# Patient Record
Sex: Female | Born: 2000 | Race: White | Hispanic: No | Marital: Single | Smoking: Never smoker
Health system: Southern US, Community
[De-identification: ages and names within clinical notes are randomized; demographics above are authoritative.]

## PROBLEM LIST (undated history)

## (undated) DIAGNOSIS — F32A Depression, unspecified: Secondary | ICD-10-CM

## (undated) DIAGNOSIS — F319 Bipolar disorder, unspecified: Secondary | ICD-10-CM

## (undated) DIAGNOSIS — L309 Dermatitis, unspecified: Secondary | ICD-10-CM

## (undated) DIAGNOSIS — F449 Dissociative and conversion disorder, unspecified: Secondary | ICD-10-CM

## (undated) DIAGNOSIS — F419 Anxiety disorder, unspecified: Secondary | ICD-10-CM

## (undated) DIAGNOSIS — F909 Attention-deficit hyperactivity disorder, unspecified type: Secondary | ICD-10-CM

## (undated) DIAGNOSIS — Z7289 Other problems related to lifestyle: Secondary | ICD-10-CM

## (undated) HISTORY — DX: Dermatitis, unspecified: L30.9

---
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
## 2022-01-25 ENCOUNTER — Telehealth: Payer: BC Managed Care – PPO | Admitting: Physician Assistant

## 2022-01-25 NOTE — Progress Notes (Signed)
The patient no-showed for appointment despite this provider sending direct link, reaching out via phone with no response and waiting for at least 10 minutes from appointment time for patient to join. They will be marked as a NS for this appointment/time.  ? ?Devoiry Corriher Cody Demetreus Lothamer, PA-C ? ? ? ?

## 2022-01-26 ENCOUNTER — Ambulatory Visit
Admission: EM | Admit: 2022-01-26 | Discharge: 2022-01-26 | Disposition: A | Discharge status: Home / Self Care | Payer: BC Managed Care – PPO | Attending: Emergency Medicine | Admitting: Emergency Medicine

## 2022-01-26 DIAGNOSIS — J329 Chronic sinusitis, unspecified: Secondary | ICD-10-CM | POA: Diagnosis not present

## 2022-01-26 DIAGNOSIS — R051 Acute cough: Secondary | ICD-10-CM

## 2022-01-26 DIAGNOSIS — J310 Chronic rhinitis: Secondary | ICD-10-CM | POA: Insufficient documentation

## 2022-01-26 DIAGNOSIS — J358 Other chronic diseases of tonsils and adenoids: Secondary | ICD-10-CM | POA: Diagnosis not present

## 2022-01-26 DIAGNOSIS — J3089 Other allergic rhinitis: Secondary | ICD-10-CM | POA: Insufficient documentation

## 2022-01-26 DIAGNOSIS — R0982 Postnasal drip: Secondary | ICD-10-CM

## 2022-01-26 DIAGNOSIS — K1370 Unspecified lesions of oral mucosa: Secondary | ICD-10-CM | POA: Diagnosis not present

## 2022-01-26 DIAGNOSIS — J351 Hypertrophy of tonsils: Secondary | ICD-10-CM

## 2022-01-26 DIAGNOSIS — J302 Other seasonal allergic rhinitis: Secondary | ICD-10-CM | POA: Diagnosis not present

## 2022-01-26 DIAGNOSIS — J029 Acute pharyngitis, unspecified: Secondary | ICD-10-CM

## 2022-01-26 LAB — POCT RAPID STREP A (OFFICE): Rapid Strep A Screen: NEGATIVE

## 2022-01-26 MED ORDER — FLUTICASONE PROPIONATE 50 MCG/ACT NA SUSP: 1.0000 | Freq: Every day | NASAL | 1 refills | Status: DC

## 2022-01-26 MED ORDER — FEXOFENADINE HCL 180 MG PO TABS: 180.0000 mg | ORAL_TABLET | Freq: Every day | ORAL | 1 refills | Status: DC

## 2022-01-26 MED ORDER — NYSTATIN 100000 UNIT/ML MT SUSP: 500000.0000 [IU] | Freq: Four times a day (QID) | OROMUCOSAL | 0 refills | Status: DC

## 2022-01-26 MED ORDER — METHYLPREDNISOLONE 4 MG PO TBPK: ORAL_TABLET | ORAL | 0 refills | Status: DC

## 2022-01-26 MED ORDER — CHLORHEXIDINE GLUCONATE 0.12 % MT SOLN: 15.0000 mL | Freq: Two times a day (BID) | OROMUCOSAL | 0 refills | Status: DC

## 2022-01-26 NOTE — ED Provider Notes (Addendum)
?[facility] ? ? ? ?CSN: [id number] ?Arrival date & time: 01/26/22  4827 ?  ? ?HISTORY  ? ?Chief Complaint  ?Patient presents with  ? Otalgia  ? Nasal Congestion  ? Sore Throat  ? ?HPI ?Monica Burke is a 21 y.o. adult. Patient complains of congestion with clear sinus drainage, sore throat, bilateral pressure in her ears without meaningful pain and some small white spots on the top of her mouth towards the back.  Patient states she is also had a "vicious" cough that is worse at night and only occasionally productive of small amounts of sputum.  Patient states her symptoms began 2 days ago.  Patient states she has a history of significant allergies, states allergy medications do not work so she is not taking any at this time.  Patient states she noticed after recently coming back from the beach that while she was at the beach her symptoms improved and when she returned home they got worse again.  Patient denies fever, aches, chills, nausea, vomiting, diarrhea, difficulty swallowing, difficulty maintaining secretions, shortness of breath, wheezing, skin manifestations. ? ?The history is provided by the patient.  ?Past Medical History:  ?Diagnosis Date  ? ADHD (attention deficit hyperactivity disorder)   ? Anxiety   ? Bipolar disorder (HCC)   ? Conversion disorder   ? Depression   ? Dissociative disorder   ? Eczema   ? Self mutilating behavior   ? ?Patient Active Problem List  ? Diagnosis Date Noted  ? COVID-19   ? Seizure-like activity (HCC) 01/30/2021  ? Severe recurrent major depression without psychotic features (HCC) 01/06/2021  ? Altered mental status 01/06/2021  ? Attention deficit hyperactivity disorder (ADHD), combined type 07/30/2019  ? Generalized anxiety disorder 07/30/2019  ? Depression in pediatric patient 07/30/2019  ? History of self injurious behavior 07/30/2019  ? Social anxiety disorder 07/30/2019  ? 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 ?History reviewed. No pertinent surgical  history. ?OB History   ?No obstetric history on file. ?  ? ?Home Medications   ? ?Prior to Admission medications   ?Medication Sig Start Date End Date Taking? Authorizing Provider  ?chlorhexidine (PERIDEX) 0.12 % solution Use as directed 15 mLs in the mouth or throat 2 (two) times daily. 01/26/22  Yes Theadora Rama Scales, PA-C  ?fexofenadine (ALLEGRA) 180 MG tablet Take 1 tablet (180 mg total) by mouth daily. 01/26/22 07/25/22 Yes Theadora Rama Scales, PA-C  ?fluticasone (FLONASE) 50 MCG/ACT nasal spray Place 1 spray into both nostrils daily. Begin by using 2 sprays in each nare daily for 3 to 5 days, then decrease to 1 spray in each nare daily. 01/26/22  Yes Theadora Rama Scales, PA-C  ?methylPREDNISolone (MEDROL DOSEPAK) 4 MG TBPK tablet Take 24 mg on day 1, 20 mg on day 2, 16 mg on day 3, 12 mg on day 4, 8 mg on day 5, 4 mg on day 6. 01/26/22  Yes Theadora Rama Scales, PA-C  ?nystatin (MYCOSTATIN) 100000 UNIT/ML suspension Take 5 mLs (500,000 Units total) by mouth 4 (four) times daily. 01/26/22  Yes Theadora Rama Scales, PA-C  ?lamoTRIgine (LAMICTAL) 25 MG tablet Take 75 mg by mouth daily. 12/23/20   [provider]  ?buPROPion (WELLBUTRIN XL) 150 MG 24 hr tablet Take 150 mg by mouth daily. 12/23/20 01/06/21  [provider]  ? ?Family History ?Family History  ?Problem Relation Age of Onset  ? Anxiety disorder Mother   ? Depression Mother   ? Diabetes type I Mother   ?  Hyperlipidemia Mother   ? Heart disease Mother   ? Alcohol abuse Mother   ? Drug abuse Mother   ? Drug abuse Father   ? Anxiety disorder Sister   ? Depression Sister   ? Anxiety disorder Maternal Grandmother   ? Depression Maternal Grandmother   ? Drug abuse Maternal Grandmother   ? Heart disease Maternal Grandfather   ? Hyperlipidemia Maternal Grandfather   ? Hypertension Maternal Grandfather   ? Alcohol abuse Maternal Grandfather   ? Drug abuse Maternal Grandfather   ? Hyperlipidemia Paternal Grandmother   ? Alcohol abuse  Paternal Grandmother   ? ?Social History ?Social History  ? ?Tobacco Use  ? Smoking status: Never  ? Smokeless tobacco: Never  ?Vaping Use  ? Vaping Use: Every day  ?Substance Use Topics  ? Alcohol use: No  ? Drug use: No  ? ?Allergies   ?Wellbutrin [bupropion] and Latex ? ?Review of Systems ?Review of Systems ?Pertinent findings noted in history of present illness.  ? ?Physical Exam ?Triage Vital Signs ?ED Triage Vitals  ?Enc Vitals Group  ?   BP 08/13/21 0827 (!) 147/82  ?   Pulse Rate 08/13/21 0827 72  ?   Resp 08/13/21 0827 18  ?   Temp 08/13/21 0827 98.3 ?F (36.8 ?C)  ?   Temp Source 08/13/21 0827 Oral  ?   SpO2 08/13/21 0827 98 %  ?   Weight --   ?   Height --   ?   Head Circumference --   ?   Peak Flow --   ?   Pain Score 08/13/21 0826 5  ?   Pain Loc --   ?   Pain Edu? --   ?   Excl. in GC? --   ?No data found. ? ?Updated Vital Signs ?BP 119/82 (BP Location: Left Arm)   Pulse 98   Temp 99.7 ?F (37.6 ?C) (Oral)   Resp 18   LMP 01/17/2022 (Approximate)   SpO2 96%  ? ?Physical Exam ?Vitals and nursing note reviewed.  ?Constitutional:   ?   General: Der Ferraiolo is not in acute distress. ?   Appearance: Normal appearance. Lujuana Both is not ill-appearing.  ?HENT:  ?   Head: Normocephalic and atraumatic.  ?   Salivary Glands: Right salivary gland is not diffusely enlarged or tender. Left salivary gland is not diffusely enlarged or tender.  ?   Right Ear: Ear canal and external ear normal. No drainage. A middle ear effusion is present. There is no impacted cerumen. Tympanic membrane is bulging. Tympanic membrane is not injected or erythematous.  ?   Left Ear: Ear canal and external ear normal. No drainage. A middle ear effusion is present. There is no impacted cerumen. Tympanic membrane is bulging. Tympanic membrane is not injected or erythematous.  ?   Ears:  ?   Comments: Bilateral EACs normal, both TMs bulging with clear fluid ?   Nose: Rhinorrhea present. No nasal deformity, septal deviation, signs of  injury, nasal tenderness, mucosal edema or congestion. Rhinorrhea is clear.  ?   Right Nostril: Occlusion present. No foreign body, epistaxis or septal hematoma.  ?   Left Nostril: Occlusion present. No foreign body, epistaxis or septal hematoma.  ?   Right Turbinates: Enlarged, swollen and pale.  ?   Left Turbinates: Enlarged, swollen and pale.  ?   Right Sinus: No maxillary sinus tenderness or frontal sinus tenderness.  ?   Left Sinus: No maxillary  sinus tenderness or frontal sinus tenderness.  ?   Mouth/Throat:  ?   Lips: Pink. No lesions.  ?   Mouth: Mucous membranes are moist. No oral lesions.  ?   Tongue: Lesions present.  ?   Pharynx: Oropharynx is clear. Uvula midline. No posterior oropharyngeal erythema or uvula swelling.  ?   Tonsils: No tonsillar exudate. 0 on the right. 0 on the left.  ?   Comments: Postnasal drip. ? ?Small white lesions located on the right posterior portion of the palate without erythema or irritation.  Patient states these are nontender to palpation. ?Eyes:  ?   General: Lids are normal.     ?   Right eye: No discharge.     ?   Left eye: No discharge.  ?   Extraocular Movements: Extraocular movements intact.  ?   Conjunctiva/sclera: Conjunctivae normal.  ?   Right eye: Right conjunctiva is not injected.  ?   Left eye: Left conjunctiva is not injected.  ?Neck:  ?   Trachea: Trachea and phonation normal.  ?Cardiovascular:  ?   Rate and Rhythm: Normal rate and regular rhythm.  ?   Pulses: Normal pulses.  ?   Heart sounds: Normal heart sounds. No murmur heard. ?  No friction rub. No gallop.  ?Pulmonary:  ?   Effort: Pulmonary effort is normal. No accessory muscle usage, prolonged expiration or respiratory distress.  ?   Breath sounds: Normal breath sounds. No stridor, decreased air movement or transmitted upper airway sounds. No decreased breath sounds, wheezing, rhonchi or rales.  ?Chest:  ?   Chest wall: No tenderness.  ?Musculoskeletal:     ?   General: Normal range of motion.  ?    Cervical back: Normal range of motion and neck supple. Normal range of motion.  ?Lymphadenopathy:  ?   Cervical: No cervical adenopathy.  ?Skin: ?   General: Skin is warm and dry.  ?   Findings: No erythema or rash.  ?Neurol

## 2022-01-26 NOTE — ED Triage Notes (Signed)
Pt c/o congestion, sore throat, bilateral otalgia and seeing white spots in the back of her throat.  ?Started: Monday night  ?

## 2022-01-26 NOTE — Discharge Instructions (Addendum)
Your strep test today is negative.  Throat culture will be performed per our protocol.  The result of your throat culture will be posted to your MyChart once it is complete, this typically takes 3 to 5 days.  If there is a positive result, you will be contacted by phone and antibiotics will be prescribed for you.  Please discard your toothbrush and any other oral devices that you are currently using and replace them with new ones if your strep culture is positive within 24 hours of beginning antibiotics. ?  ?Please see the list below for recommended medications, dosages and frequencies to provide relief of your current symptoms:   ?  ?Your symptoms and my physical exam findings are concerning for exacerbation of your underlying allergies.  It is important that you begin your allergy regiment now and are consistent with taking allergy medications exactly as prescribed.  Allergy medications are preventative and therefore only work well when they are taken daily, not "as needed". ?  ?Please see the list below for recommended medications, dosages and frequencies to provide relief of your current symptoms:   ?  ?Methylprednisolone (Medrol Dosepak): This is a steroid that will significantly calm your upper and lower airways, please take one row of tablets daily with your breakfast meal starting tomorrow morning until the prescription is complete.    ?  ?Allegra (fexofenadine): This is an excellent second-generation antihistamine that helps to reduce respiratory inflammatory response to environmental allergens.  This medication is not known to cause daytime sleepiness so it can be taken in the daytime.  If you find that it does make you sleepy, please feel free to take it at bedtime. ?  ?Flonase (fluticasone): This is a steroid nasal spray that you use once daily, 1 spray in each nare.  This medication does not work well if you decide to use it only used as you feel you need to, it works best used on a daily basis.  After  3 to 5 days of use, you will notice significant reduction of the inflammation and mucus production that is currently being caused by exposure to allergens, whether seasonal or environmental.  The most common side effect of this medication is nosebleeds.  If you experience a nosebleed, please discontinue use for 1 week, then feel free to resume.  I have provided you with a prescription but you can also purchase this medication over-the-counter if your insurance will not cover it. ? ?For the lesions in your mouth, I provided you with 2 mouthwashes, chlorhexidine and nystatin.  The nystatin is used 4 times daily and the chlorhexidine is used twice daily.  Feel free to combine the 2 when using the chlorhexidine.  If these lesions have not improved or resolved after 7 to 10 days, please follow-up with your primary care or your dentist. ?  ?It is important to keep in mind that not taking your allergy medications as prescribed can increase your risk of more frequent upper respiratory infections, lower respiratory disorders, skin reactions, and eye irritations that may or may not require the use of antibiotics and steroids and can result in loss of time at work, celebrations with family and friends as well as missed social opportunities. ?  ?If you find that you have not had significant relief of your allergy symptoms in the next 7 to 10 days, please follow-up with your primary care provider or return here to urgent care for repeat evaluation and further recommendations. ?  ?Thank you for  visiting urgent care today.  We appreciate the opportunity to participate in your care. ? ?

## 2022-01-27 ENCOUNTER — Ambulatory Visit: Payer: BC Managed Care – PPO | Admitting: Nurse Practitioner

## 2022-01-28 LAB — CULTURE, GROUP A STREP (THRC)

## 2022-09-24 ENCOUNTER — Encounter (HOSPITAL_COMMUNITY): Payer: Self-pay

## 2022-09-24 ENCOUNTER — Other Ambulatory Visit: Payer: Self-pay

## 2022-09-24 ENCOUNTER — Emergency Department (HOSPITAL_COMMUNITY)
Admission: EM | Admit: 2022-09-24 | Discharge: 2022-09-24 | Disposition: A | Discharge status: Home / Self Care | Payer: BC Managed Care – PPO | Attending: Emergency Medicine | Admitting: Emergency Medicine

## 2022-09-24 DIAGNOSIS — R110 Nausea: Secondary | ICD-10-CM | POA: Diagnosis not present

## 2022-09-24 DIAGNOSIS — Z91040 Latex allergy status: Secondary | ICD-10-CM | POA: Diagnosis not present

## 2022-09-24 DIAGNOSIS — S0990XA Unspecified injury of head, initial encounter: Secondary | ICD-10-CM | POA: Diagnosis not present

## 2022-09-24 DIAGNOSIS — F10929 Alcohol use, unspecified with intoxication, unspecified: Secondary | ICD-10-CM | POA: Diagnosis not present

## 2022-09-24 DIAGNOSIS — S0083XA Contusion of other part of head, initial encounter: Secondary | ICD-10-CM | POA: Diagnosis not present

## 2022-09-24 NOTE — Discharge Instructions (Signed)
You had a normal neurologic exam while in the emergency department.  This is reassuring.  It is possible that you may have a mild concussion.  A concussion is a diagnosis that is made clinically and does not show any abnormal results on imaging such as a CT scan or MRI.    A concussion may cause a persistent headache over the next few days. This can be brought on or worsened by loud sounds or bright lights. Try to avoid excessive use of cell phones, television, video games as this may worsening headaches.  Avoid strenuous activity and heavy lifting over the next few days.  If you develop severe worsening of your headache, vision changes or loss, uncontrolled vomiting, numbness or tingling to one side of your body, difficulty walking or lifting your arms or legs, return promptly to the emergency department for repeat evaluation.
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
## 2022-10-24 ENCOUNTER — Ambulatory Visit
Admission: RE | Admit: 2022-10-24 | Discharge: 2022-10-24 | Disposition: A | Discharge status: Home / Self Care | Payer: BC Managed Care – PPO | Source: Ambulatory Visit | Attending: Emergency Medicine | Admitting: Emergency Medicine

## 2022-10-24 VITALS — BP 115/79 | HR 84 | Temp 97.5°F | Resp 16

## 2022-10-24 DIAGNOSIS — J3089 Other allergic rhinitis: Secondary | ICD-10-CM

## 2022-10-24 DIAGNOSIS — J302 Other seasonal allergic rhinitis: Secondary | ICD-10-CM | POA: Diagnosis not present

## 2022-10-24 MED ORDER — FEXOFENADINE HCL 180 MG PO TABS: 180.0000 mg | ORAL_TABLET | Freq: Every day | ORAL | 1 refills | Status: DC

## 2022-10-24 MED ORDER — FLUTICASONE PROPIONATE 50 MCG/ACT NA SUSP: 1.0000 | Freq: Every day | NASAL | 1 refills | Status: DC

## 2022-10-24 MED ORDER — TRIAMCINOLONE ACETONIDE 40 MG/ML IJ SUSP
40.0000 mg | Freq: Once | INTRAMUSCULAR | Status: AC
  Administered 2022-10-24: 40 mg via INTRAMUSCULAR

## 2022-10-24 MED ORDER — CETIRIZINE HCL 10 MG PO TABS: 10.0000 mg | ORAL_TABLET | Freq: Every day | ORAL | 1 refills | Status: DC

## 2022-10-24 MED ORDER — LEVOCETIRIZINE DIHYDROCHLORIDE 5 MG PO TABS: 5.0000 mg | ORAL_TABLET | Freq: Every evening | ORAL | 1 refills | Status: DC

## 2022-10-24 MED ORDER — MOMETASONE FUROATE 50 MCG/ACT NA SUSP: 2.0000 | Freq: Every day | NASAL | 2 refills | Status: DC

## 2022-10-24 NOTE — ED Provider Notes (Signed)
[facility]    CSN: [id number] Arrival date & time: 10/24/22  1641    HISTORY   Chief Complaint  Patient presents with   Nasal Congestion    Possible sinus infection - Entered by patient   Cough   HPI Monica Burke is a pleasant, 22 y.o. adult who presents to urgent care today. Patient complains of nasal congestion, runny nose and nonproductive cough.  Patient states her symptoms began 3 days ago.  Patient states has been taking over-the-counter cold medication without meaningful relief of her symptoms.  Patient was seen at this location in March of 2023 where she was diagnosed with allergic rhinitis and provided with prescriptions for allergy medications.  Patient states she is no longer taking any allergy medications.  Patient states she went out of town a little over a week ago and stayed at a condo at [employer], states it was snowing and damp at the time and has been a lot of time outside, thinks she may have caught a cold.    Past Medical History:  Diagnosis Date   ADHD (attention deficit hyperactivity disorder)    Anxiety    Bipolar disorder ([place])    Conversion disorder    Depression    Dissociative disorder    Eczema    Self mutilating behavior    Patient Active Problem List   Diagnosis Date Noted   COVID-19    Seizure-like activity ([place]) 01/30/2021   Severe recurrent major depression without psychotic features (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 No pertinent surgical history. OB History   No obstetric history on file.    Home Medications    Prior to Admission medications   Medication Sig Start Date End Date Taking? Authorizing  Provider  chlorhexidine (PERIDEX) 0.12 % solution Use as directed 15 mLs in the mouth or throat 2 (two) times daily. 01/26/22   Lynden Oxford Scales, PA-C  fexofenadine (ALLEGRA) 180 MG tablet Take 1 tablet (180 mg total) by mouth daily. 01/26/22 07/25/22  Lynden Oxford Scales, PA-C  fluticasone (FLONASE) 50 MCG/ACT nasal spray Place 1 spray into both nostrils daily. Begin by using 2 sprays in each nare daily for 3 to 5 days, then decrease to 1 spray in each nare daily. 01/26/22   Lynden Oxford Scales, PA-C  lamoTRIgine (LAMICTAL) 25 MG tablet Take 75 mg by mouth daily. 12/23/20   [provider]  methylPREDNISolone (MEDROL DOSEPAK) 4 MG TBPK tablet Take 24 mg on day 1, 20 mg on day 2, 16 mg on day 3, 12 mg on day 4, 8 mg on day 5, 4 mg on day 6. 01/26/22   Lynden Oxford Scales, PA-C  nystatin (MYCOSTATIN) 100000 UNIT/ML suspension Take 5 mLs (500,000 Units total) by mouth 4 (four) times daily. 01/26/22   Lynden Oxford Scales, PA-C  buPROPion (WELLBUTRIN XL) 150 MG 24 hr tablet Take 150 mg by mouth daily. 12/23/20 01/06/21  [provider]    Family History Family History  Problem Relation Age of Onset   Anxiety disorder Mother    Depression Mother    Diabetes type I Mother    Hyperlipidemia Mother    Heart disease Mother    Alcohol abuse Mother  Drug abuse Mother    Drug abuse Father    Anxiety disorder Sister    Depression Sister    Anxiety disorder Maternal Grandmother    Depression Maternal Grandmother    Drug abuse Maternal Grandmother    Heart disease Maternal Grandfather    Hyperlipidemia Maternal Grandfather    Hypertension Maternal Grandfather    Alcohol abuse Maternal Grandfather    Drug abuse Maternal Grandfather    Hyperlipidemia Paternal Grandmother    Alcohol abuse Paternal Grandmother    Social History Social History   Tobacco Use   Smoking status: Never   Smokeless tobacco: Never  Vaping Use   Vaping Use: Every day  Substance Use Topics    Alcohol use: Yes   Drug use: No   Allergies   Wellbutrin [bupropion] and Latex  Review of Systems Review of Systems Pertinent findings revealed after performing a 14 point review of systems has been noted in the history of present illness.  Physical Exam Triage Vital Signs ED Triage Vitals  Enc Vitals Group     BP 08/13/21 0827 (!) 147/82     Pulse Rate 08/13/21 0827 72     Resp 08/13/21 0827 18     Temp 08/13/21 0827 98.3 F (36.8 C)     Temp Source 08/13/21 0827 Oral     SpO2 08/13/21 0827 98 %     Weight --      Height --      Head Circumference --      Peak Flow --      Pain Score 08/13/21 0826 5     Pain Loc --      Pain Edu? --      Excl. in [place]? --   No data found.  Updated Vital Signs BP 115/79 (BP Location: Right Arm)   Pulse 84   Temp (!) 97.5 F (36.4 C) (Oral)   Resp 16   LMP 10/05/2022   SpO2 96%   Physical Exam Vitals and nursing note reviewed.  Constitutional:      General: Monica Burke is not in acute distress.    Appearance: Normal appearance. Monica Burke is not ill-appearing.  HENT:     Head: Normocephalic and atraumatic.     Salivary Glands: Right salivary gland is not diffusely enlarged or tender. Left salivary gland is not diffusely enlarged or tender.     Right Ear: Ear canal and external ear normal. No drainage. A middle ear effusion is present. There is no impacted cerumen. Tympanic membrane is bulging. Tympanic membrane is not injected or erythematous.     Left Ear: Ear canal and external ear normal. No drainage. A middle ear effusion is present. There is no impacted cerumen. Tympanic membrane is bulging. Tympanic membrane is not injected or erythematous.     Ears:     Comments: Bilateral EACs normal, both TMs bulging with clear fluid    Nose: Rhinorrhea present. No nasal deformity, septal deviation, signs of injury, nasal tenderness, mucosal edema or congestion. Rhinorrhea is clear.     Right Nostril: Occlusion present. No foreign body,  epistaxis or septal hematoma.     Left Nostril: Occlusion present. No foreign body, epistaxis or septal hematoma.     Right Turbinates: Enlarged, swollen and pale.     Left Turbinates: Enlarged, swollen and pale.     Right Sinus: No maxillary sinus tenderness or frontal sinus tenderness.     Left Sinus: No maxillary sinus tenderness or frontal sinus tenderness.  Comments: Multiple nose piercings    Mouth/Throat:     Lips: Pink. No lesions.     Mouth: Mucous membranes are moist. No oral lesions.     Pharynx: Oropharynx is clear. Uvula midline. No posterior oropharyngeal erythema or uvula swelling.     Tonsils: No tonsillar exudate. 0 on the right. 0 on the left.     Comments: Postnasal drip Eyes:     General: Lids are normal.        Right eye: No discharge.        Left eye: No discharge.     Extraocular Movements: Extraocular movements intact.     Conjunctiva/sclera: Conjunctivae normal.     Right eye: Right conjunctiva is not injected.     Left eye: Left conjunctiva is not injected.  Neck:     Trachea: Trachea and phonation normal.  Cardiovascular:     Rate and Rhythm: Normal rate and regular rhythm.     Pulses: Normal pulses.     Heart sounds: Normal heart sounds. No murmur heard.    No friction rub. No gallop.  Pulmonary:     Effort: Pulmonary effort is normal. No accessory muscle usage, prolonged expiration or respiratory distress.     Breath sounds: Normal breath sounds. No stridor, decreased air movement or transmitted upper airway sounds. No decreased breath sounds, wheezing, rhonchi or rales.  Chest:     Chest wall: No tenderness.  Musculoskeletal:        General: Normal range of motion.     Cervical back: Normal range of motion and neck supple. Normal range of motion.  Lymphadenopathy:     Cervical: No cervical adenopathy.  Skin:    General: Skin is warm and dry.     Findings: No erythema or rash.  Neurological:     General: No focal deficit present.     Mental  Status: Monica Burke is alert and oriented to person, place, and time.  Psychiatric:        Mood and Affect: Mood normal.        Behavior: Behavior normal.     Visual Acuity Right Eye Distance:   Left Eye Distance:   Bilateral Distance:    Right Eye Near:   Left Eye Near:    Bilateral Near:     UC Couse / Diagnostics / Procedures:     Radiology No results found.  Procedures Procedures (including critical care time) EKG  Pending results:  Labs Reviewed - No data to display  Medications Ordered in UC: Medications  triamcinolone acetonide (KENALOG-40) injection 40 mg (40 mg Intramuscular Given 10/24/22 1751)    UC Diagnoses / Final Clinical Impressions(s)   I have reviewed the triage vital signs and the nursing notes.  Pertinent labs & imaging results that were available during my care of the patient were reviewed by me and considered in my medical decision making (see chart for details).    Final diagnoses:  Perennial allergic rhinitis with seasonal variation   Provided for rapid relief of symptoms.  Patient advised to resume allergy medications.  Return precautions advised.  Several prescriptions provided to patient for best price. Please see discharge instructions below for further details of plan of care as provided to patient. ED Prescriptions     Medication Sig Dispense Auth. Provider   fexofenadine (ALLEGRA) 180 MG tablet Take 1 tablet (180 mg total) by mouth daily. 90 tablet Lynden Oxford Scales, PA-C   cetirizine (ZYRTEC ALLERGY) 10 MG tablet Take  1 tablet (10 mg total) by mouth at bedtime. 90 tablet Theadora Rama Scales, PA-C   levocetirizine (XYZAL) 5 MG tablet Take 1 tablet (5 mg total) by mouth every evening. 90 tablet Theadora Rama Scales, PA-C   mometasone (NASONEX) 50 MCG/ACT nasal spray Place 2 sprays into the nose daily. 1 each Theadora Rama Scales, PA-C   fluticasone (FLONASE) 50 MCG/ACT nasal spray Place 1 spray into both nostrils daily. 47.4  mL Theadora Rama Scales, PA-C      PDMP not reviewed this encounter.  Disposition Upon Discharge:  Condition: stable for discharge home Home: take medications as prescribed; routine discharge instructions as discussed; follow up as advised.  Patient presented with an acute illness with associated systemic symptoms and significant discomfort requiring urgent management. In my opinion, this is a condition that a prudent lay person (someone who possesses an average knowledge of health and medicine) may potentially expect to result in complications if not addressed urgently such as respiratory distress, impairment of bodily function or dysfunction of bodily organs.   Routine symptom specific, illness specific and/or disease specific instructions were discussed with the patient and/or caregiver at length.   As such, the patient has been evaluated and assessed, work-up was performed and treatment was provided in alignment with urgent care protocols and evidence based medicine.  Patient/parent/caregiver has been advised that the patient may require follow up for further testing and treatment if the symptoms continue in spite of treatment, as clinically indicated and appropriate.  If the patient was tested for COVID-19, Influenza and/or RSV, then the patient/parent/guardian was advised to isolate at home pending the results of his/her diagnostic coronavirus test and potentially longer if they're positive. I have also advised pt that if his/her COVID-19 test returns positive, it's recommended to self-isolate for at least 10 days after symptoms first appeared AND until fever-free for 24 hours without fever reducer AND other symptoms have improved or resolved. Discussed self-isolation recommendations as well as instructions for household member/close contacts as per the [facility] and [place] DHHS, and also gave patient the COVID packet with this information.  Patient/parent/caregiver has been advised to return to the  [facility] or PCP in 3-5 days if no better; to PCP or the Emergency Department if new signs and symptoms develop, or if the current signs or symptoms continue to change or worsen for further workup, evaluation and treatment as clinically indicated and appropriate  The patient will follow up with their current PCP if and as advised. If the patient does not currently have a PCP we will assist them in obtaining one.   The patient may need specialty follow up if the symptoms continue, in spite of conservative treatment and management, for further workup, evaluation, consultation and treatment as clinically indicated and appropriate.  Patient/parent/caregiver verbalized understanding and agreement of plan as discussed.  All questions were addressed during visit.  Please see discharge instructions below for further details of plan.  Discharge Instructions:   Discharge Instructions      You have what is called "perennial allergic rhinitis with seasonal variation".  This means you always have a little bit of allergies but every now and then, they get worse.  You may find that your allergies are worse during certain seasons of the year or, in this instance, just staying somewhere like [place] the [place] for a few days, trigger shoe.  It is okay to stop your allergy medications if you are feeling well.  The trick is to be aware  of when you need to restart them.  In an ideal world, it would be great for you to always begin taking your allergy medication before you travel or stay in an unfamiliar location about a week before traveling and to always remember to take them about 2 weeks before changes of season.  Please do not ever hesitate to come back here to see [place] if your allergies have flared up any needle extra help.  You received an injection of Kenalog today which should give you meaningful relief for the next 24 hours while your allergy medications begin to do their job.  I have sent prescription refills  for Allegra and Flonase to your pharmacy.  You may find that different allergy medications work better than others, personally I find that Nasonex (mometasone) works better than Flonase (fluticasone) but is more expensive.  If you take a look around on [place] or shop at [employer] sometimes you can find a good deal for Nasonex.  I do personally find Allegra works better for me but some people find that Xyzal (levocetirizine) or Zyrtec (cetirizine) is a better antihistamine.  Again good prices can be found on [employer] and at [place] can also shop using the [employer] and have your prescriptions filled at the cheapest pharmacy.  Thank you for visiting [place] here at urgent care again.  Please let us know if there is anything else we can do for you.        This office note has been dictated using Museum/gallery curator.  Unfortunately, this method of dictation can sometimes lead to typographical or grammatical errors.  I apologize for your inconvenience in advance if this occurs.  Please do not hesitate to reach out to me if clarification is needed.      Lynden Oxford Scales, PA-C 10/24/22 1758

## 2022-10-24 NOTE — Discharge Instructions (Addendum)
You have what is called "perennial allergic rhinitis with seasonal variation".  This means you always have a little bit of allergies but every now and then, they get worse.  You may find that your allergies are worse during certain seasons of the year or, in this instance, just staying somewhere like a [place] the [place] for a few days, trigger shoe.  It is okay to stop your allergy medications if you are feeling well.  The trick is to be aware of when you need to restart them.  In an ideal world, it would be great for you to always begin taking your allergy medication before you travel or stay in an unfamiliar location about a week before traveling and to always remember to take them about 2 weeks before changes of season.  Please do not ever hesitate to come back here to see [place] if your allergies have flared up any needle extra help.  You received an injection of Kenalog today which should give you meaningful relief for the next 24 hours while your allergy medications begin to do their job.  I have sent prescription refills for Allegra and Flonase to your pharmacy.  You may find that different allergy medications work better than others, personally I find that Nasonex (mometasone) works better than Flonase (fluticasone) but is more expensive.  If you take a look around on [place] or shop at [employer] sometimes you can find a good deal for Nasonex.  I do personally find Allegra works better for me but some people find that Xyzal (levocetirizine) or Zyrtec (cetirizine) is a better antihistamine.  Again good prices can be found on [employer] and at [place] can also shop using the [employer] and have your prescriptions filled at the cheapest pharmacy.  Thank you for visiting [place] here at urgent care again.  Please let us know if there is anything else we can do for you.

## 2022-10-24 NOTE — ED Triage Notes (Signed)
Pt c/o congestion, and cough.   Started: 3 days ago  Home interventions: OTC cold medication

## 2022-11-16 ENCOUNTER — Ambulatory Visit (HOSPITAL_COMMUNITY): Payer: BC Managed Care – PPO | Admitting: Psychiatry

## 2022-11-30 DIAGNOSIS — Z01419 Encounter for gynecological examination (general) (routine) without abnormal findings: Secondary | ICD-10-CM | POA: Diagnosis not present

## 2022-11-30 DIAGNOSIS — Z6838 Body mass index (BMI) 38.0-38.9, adult: Secondary | ICD-10-CM | POA: Diagnosis not present

## 2022-11-30 DIAGNOSIS — N926 Irregular menstruation, unspecified: Secondary | ICD-10-CM | POA: Diagnosis not present

## 2022-12-15 DIAGNOSIS — Z30430 Encounter for insertion of intrauterine contraceptive device: Secondary | ICD-10-CM | POA: Diagnosis not present

## 2022-12-15 DIAGNOSIS — Z118 Encounter for screening for other infectious and parasitic diseases: Secondary | ICD-10-CM | POA: Diagnosis not present

## 2022-12-15 DIAGNOSIS — Z3202 Encounter for pregnancy test, result negative: Secondary | ICD-10-CM | POA: Diagnosis not present

## 2022-12-19 DIAGNOSIS — Z30431 Encounter for routine checking of intrauterine contraceptive device: Secondary | ICD-10-CM | POA: Diagnosis not present

## 2023-01-03 ENCOUNTER — Encounter (HOSPITAL_COMMUNITY): Payer: BC Managed Care – PPO | Admitting: Psychiatry

## 2023-01-03 ENCOUNTER — Encounter (HOSPITAL_COMMUNITY): Payer: Self-pay

## 2023-01-03 NOTE — Progress Notes (Signed)
This encounter was created in error - please disregard.

## 2023-10-04 ENCOUNTER — Ambulatory Visit (HOSPITAL_BASED_OUTPATIENT_CLINIC_OR_DEPARTMENT_OTHER): Payer: BC Managed Care – PPO | Admitting: Family

## 2023-10-04 VITALS — BP 119/81 | HR 79 | Ht 65.5 in | Wt 252.0 lb

## 2023-10-04 DIAGNOSIS — F332 Major depressive disorder, recurrent severe without psychotic features: Secondary | ICD-10-CM

## 2023-10-04 MED ORDER — TRAZODONE HCL 50 MG PO TABS: 50.0000 mg | ORAL_TABLET | Freq: Every day | ORAL | 0 refills | Status: DC

## 2023-10-04 NOTE — Progress Notes (Signed)
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 Severe recurrent major depression without psychotic features (HCC)  F33.2       History of Present Illness:  Monica Burke 22 year old Caucasian female presents to establish care.  She reports a history related to generalized anxiety disorder, mood disorder, major depressive disorder, attention deficit disorder and self injures behaviors.  Reports she has not been sleeping well due to shift work.  States she was employed by a [employer] however has since resigned and has been unable to get adequate sleep.  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  She denied that she has been prescribed any psychotropic medications/SSRIs to help with her mood.  Chart review patient was discontinued from Zoloft, reported self discontinuation from Lamictal as she denied that she is followed up with neurology and reported she has not taking Wellbutrin as directed.  Major depressive disorder: Sleep disturbance:  Initiated trazodone 25 to 50 mg nightly as needed -Referred for therapy services may benefit for cognitive therapy and/or DBT therapy  Reports her symptoms include increased depression worsening anxiety mood irritability unable to hold employment, panic/anxiety attack and overeating.  Reports having trouble with trying to sleep at night.  States she often wakes up in a disassociative state.  States she currently resides with her parents and her boyfriend.  She reports she has been with her boyfriend for the past year who is supportive.  She denied that she is currently employed or attending college classes at this time.   Denied illicit drug use or substance abuse history.  Reports occasional alcohol use every few months.  ring evaluation Ziara Cumpston is sitting; she is alert/oriented x 4; calm/cooperative; and mood congruent with affect.  Patient is speaking in a clear tone at moderate volume, and normal pace; with good eye contact. Her thought process is coherent and relevant; There is no indication that she is currently responding to internal/external stimuli or experiencing delusional thought content.  Patient denies suicidal/self-harm/homicidal ideation, psychosis, and paranoia.  Patient has remained calm throughout assessment and has answered questions appropriately.   Associated Signs/Symptoms: Depression Symptoms:  depressed mood, difficulty concentrating, anxiety, (Hypo) Manic Symptoms:  Distractibility, Anxiety Symptoms:  Excessive Worry, Psychotic Symptoms:  Hallucinations: None PTSD Symptoms: NA  Past Psychiatric History:   Previous Psychotropic Medications: Yes   Substance Abuse History in the last 12 months:  No.  Consequences of Substance Abuse: NA  Past Medical History:  Past Medical History:  Diagnosis Date   ADHD (attention deficit hyperactivity disorder)    Anxiety    Bipolar disorder (HCC)    Conversion disorder    Depression    Dissociative disorder    Eczema    Self mutilating behavior    No past surgical history on file.  Family Psychiatric History:  reported father is Bipolar, mother and sister: Dx with MDD  Family History:  Family History  Problem Relation Age of Onset   Anxiety disorder Mother    Depression Mother    Diabetes type I Mother    Hyperlipidemia Mother    Heart disease Mother    Alcohol abuse Mother    Drug abuse Mother  Drug abuse Father    Anxiety disorder Sister    Depression Sister    Anxiety disorder Maternal Grandmother    Depression Maternal Grandmother    Drug abuse Maternal Grandmother    Heart disease Maternal Grandfather     Hyperlipidemia Maternal Grandfather    Hypertension Maternal Grandfather    Alcohol abuse Maternal Grandfather    Drug abuse Maternal Grandfather    Hyperlipidemia Paternal Grandmother    Alcohol abuse Paternal Grandmother     Social History:   Social History   Socioeconomic History   Marital status: Single    Spouse name: Not on file   Number of children: Not on file   Years of education: Not on file   Highest education level: Not on file  Occupational History   Not on file  Tobacco Use   Smoking status: Never   Smokeless tobacco: Never  Vaping Use   Vaping status: Every Day  Substance and Sexual Activity   Alcohol use: Yes   Drug use: No   Sexual activity: Never  Other Topics Concern   Not on file  Social History Narrative   Not on file   Social Drivers of Health   Financial Resource Strain: Not on file  Food Insecurity: Not on file  Transportation Needs: Not on file  Physical Activity: Not on file  Stress: Not on file  Social Connections: Not on file    Additional Social History:   Allergies:   Allergies  Allergen Reactions   Wellbutrin [Bupropion] Other (See Comments)    seizures   Latex Rash    Metabolic Disorder Labs: No results found for: "HGBA1C", "MPG" No results found for: "PROLACTIN" No results found for: "CHOL", "TRIG", "HDL", "CHOLHDL", "VLDL", "LDLCALC" No results found for: "TSH"  Therapeutic Level Labs: No results found for: "LITHIUM" No results found for: "CBMZ" No results found for: "VALPROATE"  Current Medications: Current Outpatient Medications  Medication Sig Dispense Refill   traZODone (DESYREL) 50 MG tablet Take 1 tablet (50 mg total) by mouth at bedtime. 30 tablet 0   cetirizine (ZYRTEC ALLERGY) 10 MG tablet Take 1 tablet (10 mg total) by mouth at bedtime. 90 tablet 1   chlorhexidine (PERIDEX) 0.12 % solution Use as directed 15 mLs in the mouth or throat 2 (two) times daily. 120 mL 0   fexofenadine (ALLEGRA) 180 MG tablet  Take 1 tablet (180 mg total) by mouth daily. 90 tablet 1   fluticasone (FLONASE) 50 MCG/ACT nasal spray Place 1 spray into both nostrils daily. 47.4 mL 1   lamoTRIgine (LAMICTAL) 25 MG tablet Take 75 mg by mouth daily.     levocetirizine (XYZAL) 5 MG tablet Take 1 tablet (5 mg total) by mouth every evening. 90 tablet 1   mometasone (NASONEX) 50 MCG/ACT nasal spray Place 2 sprays into the nose daily. 1 each 2   nystatin (MYCOSTATIN) 100000 UNIT/ML suspension Take 5 mLs (500,000 Units total) by mouth 4 (four) times daily. 60 mL 0   No current facility-administered medications for this visit.    Musculoskeletal: Strength & Muscle Tone: within normal limits Gait & Station: normal Patient leans: N/A  Psychiatric Specialty Exam: Review of Systems  Psychiatric/Behavioral:  Positive for decreased concentration and sleep disturbance. Negative for suicidal ideas.   All other systems reviewed and are negative.   Blood pressure 119/81, pulse 79, height 5' 5.5" (1.664 m), weight 252 lb (114.3 kg).Body mass index is 41.3 kg/m.  General Appearance: Casual  Eye Contact:  Good  Speech:  Clear and Coherent  Volume:  Normal  Mood:  Anxious and Depressed  Affect:  Congruent  Thought Process:  Coherent  Orientation:  Full (Time, Place, and Person)  Thought Content:  Logical  Suicidal Thoughts:  No  Homicidal Thoughts:  No  Memory:  Immediate;   Good Recent;   Good  Judgement:  Good  Insight:  Fair  Psychomotor Activity:  Normal  Concentration:  Concentration: Good  Recall:  Good  Fund of Knowledge:Good  Language: Good  Akathisia:  No  Handed:  Right  AIMS (if indicated):  done  Assets:  Communication Skills  ADL's:  Intact  Cognition: WNL  Sleep:  Good   Screenings: PHQ2-9    Flowsheet Row [facility]  PHQ-2 Total Score 4  PHQ-9 Total Score 10      Flowsheet Row ED from 10/24/2022 in [facility] ED from 09/24/2022 in [facility] ED from 01[facility]26/2022 in [facility] at [facility]  C-SSRS RISK CATEGORY No Risk No Risk No Risk       Assessment and Plan:  Donis Sizemore is-year-old Caucasian female presents to establish care.  Reports her main stressors and reason for this visit is due to sleep disturbance. Reported she has been struggling with mental health issues since she was 22 years old. Denied that she has been consistent with psychotropic medications.  Currently denying suicidal homicidal ideations.  Denies auditory or visual hallucinations.  Discussed initiating trazodone and patient to follow-up with therapy services.  Follow-up 2 months for medication management  Major depressive disorder: Sleep disturbance:  Initiated trazodone 25 to 50 mg nightly as needed -Referred for therapy services may benefit for cognitive therapy and/or DBT therapy  Collaboration of Care: Medication Management AEB Start Trazodone 25 mg- 50 mg daily  Patient/Guardian was advised Release of Information must be obtained prior to any record release in order to collaborate their care with an outside provider. Patient/Guardian was advised if they have not already done so to contact the registration department to sign all necessary forms in order for [place] to release information regarding their care.   Consent: Patient/Guardian gives verbal consent for treatment and assignment of benefits for services provided during this visit. Patient/Guardian expressed understanding and agreed to proceed.   Oneta Rack, NP 12/18/202410:40 AM

## 2023-10-17 ENCOUNTER — Ambulatory Visit: Payer: Self-pay

## 2023-10-31 ENCOUNTER — Other Ambulatory Visit (HOSPITAL_COMMUNITY): Payer: Self-pay | Admitting: Family

## 2023-11-16 ENCOUNTER — Other Ambulatory Visit (HOSPITAL_COMMUNITY): Payer: Self-pay

## 2023-11-16 MED ORDER — TRAZODONE HCL 50 MG PO TABS: 50.0000 mg | ORAL_TABLET | Freq: Every day | ORAL | 0 refills | Status: DC

## 2023-11-29 ENCOUNTER — Encounter (HOSPITAL_COMMUNITY): Payer: Self-pay

## 2023-11-29 ENCOUNTER — Other Ambulatory Visit: Payer: Self-pay

## 2023-11-29 ENCOUNTER — Emergency Department (HOSPITAL_COMMUNITY)
Admission: EM | Admit: 2023-11-29 | Discharge: 2023-11-30 | Disposition: A | Discharge status: Home / Self Care | Payer: BC Managed Care – PPO | Attending: Emergency Medicine | Admitting: Emergency Medicine

## 2023-11-29 DIAGNOSIS — R569 Unspecified convulsions: Secondary | ICD-10-CM | POA: Diagnosis not present

## 2023-11-29 DIAGNOSIS — F69 Unspecified disorder of adult personality and behavior: Secondary | ICD-10-CM | POA: Diagnosis not present

## 2023-11-29 DIAGNOSIS — S0103XA Puncture wound without foreign body of scalp, initial encounter: Secondary | ICD-10-CM | POA: Diagnosis not present

## 2023-11-29 DIAGNOSIS — R462 Strange and inexplicable behavior: Secondary | ICD-10-CM | POA: Diagnosis not present

## 2023-11-29 DIAGNOSIS — R4182 Altered mental status, unspecified: Secondary | ICD-10-CM | POA: Diagnosis not present

## 2023-11-29 DIAGNOSIS — Z8616 Personal history of COVID-19: Secondary | ICD-10-CM | POA: Insufficient documentation

## 2023-11-29 DIAGNOSIS — F17290 Nicotine dependence, other tobacco product, uncomplicated: Secondary | ICD-10-CM | POA: Diagnosis not present

## 2023-11-29 DIAGNOSIS — R110 Nausea: Secondary | ICD-10-CM | POA: Diagnosis not present

## 2023-11-29 DIAGNOSIS — I10 Essential (primary) hypertension: Secondary | ICD-10-CM | POA: Diagnosis not present

## 2023-11-29 DIAGNOSIS — R000 Tachycardia, unspecified: Secondary | ICD-10-CM | POA: Diagnosis not present

## 2023-11-29 NOTE — ED Provider Triage Note (Signed)
Emergency Medicine Provider Triage Evaluation Note  [facility] , a 23 y.o. adult  was evaluated in triage.  Pt complains of seizure like episode. HX of similar  Review of Systems  Positive:  Negative:   Physical Exam  BP 108/70 (BP Location: Left Arm)   Pulse 78   Temp 98.4 F (36.9 C) (Oral)   Resp 16   Ht 5' 5.5" (1.664 m)   Wt 114.3 kg   LMP 11/29/2023 (Exact Date)   SpO2 100%   BMI 41.29 kg/m  Gen:   Awake, no distress   Resp:  Normal effort  MSK:   Moves extremities without difficulty  Other:    Medical Decision Making  Medically screening exam initiated at 10:56 PM.  Appropriate orders placed.  Fran Gade was informed that the remainder of the evaluation will be completed by another provider, this initial triage assessment does not replace that evaluation, and the importance of remaining in the ED until their evaluation is complete.     Glyn Ade, MD 11/29/23 2256

## 2023-11-29 NOTE — ED Triage Notes (Signed)
Pt arrived from home via [facility] s/p per mother on scene having a pseudo seizure that last 5 min per the mom. 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

## 2023-11-30 ENCOUNTER — Encounter (HOSPITAL_COMMUNITY): Payer: Self-pay | Admitting: Psychiatry

## 2023-11-30 ENCOUNTER — Emergency Department (HOSPITAL_COMMUNITY): Payer: BC Managed Care – PPO

## 2023-11-30 ENCOUNTER — Telehealth (HOSPITAL_COMMUNITY): Payer: Self-pay

## 2023-11-30 DIAGNOSIS — R4182 Altered mental status, unspecified: Secondary | ICD-10-CM | POA: Diagnosis not present

## 2023-11-30 DIAGNOSIS — S0103XA Puncture wound without foreign body of scalp, initial encounter: Secondary | ICD-10-CM | POA: Diagnosis not present

## 2023-11-30 DIAGNOSIS — F69 Unspecified disorder of adult personality and behavior: Secondary | ICD-10-CM | POA: Diagnosis present

## 2023-11-30 DIAGNOSIS — R569 Unspecified convulsions: Secondary | ICD-10-CM | POA: Diagnosis not present

## 2023-11-30 LAB — CBC WITH DIFFERENTIAL/PLATELET
Abs Immature Granulocytes: 0.03 10*3/uL (ref 0.00–0.07)
Basophils Absolute: 0 10*3/uL (ref 0.0–0.1)
Basophils Relative: 0 %
Eosinophils Absolute: 0 10*3/uL (ref 0.0–0.5)
Eosinophils Relative: 0 %
HCT: 41.1 % (ref 36.0–46.0)
Hemoglobin: 13.3 g/dL (ref 12.0–15.0)
Immature Granulocytes: 0 %
Lymphocytes Relative: 12 %
Lymphs Abs: 1.3 10*3/uL (ref 0.7–4.0)
MCH: 28.2 pg (ref 26.0–34.0)
MCHC: 32.4 g/dL (ref 30.0–36.0)
MCV: 87.3 fL (ref 80.0–100.0)
Monocytes Absolute: 0.4 10*3/uL (ref 0.1–1.0)
Monocytes Relative: 4 %
Neutro Abs: 8.6 10*3/uL — ABNORMAL HIGH (ref 1.7–7.7)
Neutrophils Relative %: 84 %
Platelets: 330 10*3/uL (ref 150–400)
RBC: 4.71 MIL/uL (ref 3.87–5.11)
RDW: 13 % (ref 11.5–15.5)
WBC: 10.4 10*3/uL (ref 4.0–10.5)
nRBC: 0 % (ref 0.0–0.2)

## 2023-11-30 LAB — RAPID URINE DRUG SCREEN, HOSP PERFORMED
Amphetamines: NOT DETECTED
Barbiturates: NOT DETECTED
Benzodiazepines: NOT DETECTED
Cocaine: NOT DETECTED
Opiates: NOT DETECTED
Tetrahydrocannabinol: NOT DETECTED

## 2023-11-30 LAB — URINALYSIS, W/ REFLEX TO CULTURE (INFECTION SUSPECTED)
Bilirubin Urine: NEGATIVE
Glucose, UA: NEGATIVE mg/dL
Ketones, ur: NEGATIVE mg/dL
Leukocytes,Ua: NEGATIVE
Nitrite: NEGATIVE
Protein, ur: NEGATIVE mg/dL
Specific Gravity, Urine: 1.025 (ref 1.005–1.030)
pH: 5 (ref 5.0–8.0)

## 2023-11-30 LAB — BASIC METABOLIC PANEL
Anion gap: 7 (ref 5–15)
BUN: 8 mg/dL (ref 6–20)
CO2: 23 mmol/L (ref 22–32)
Calcium: 8.1 mg/dL — ABNORMAL LOW (ref 8.9–10.3)
Chloride: 111 mmol/L (ref 98–111)
Creatinine, Ser: 0.69 mg/dL (ref 0.44–1.00)
GFR, Estimated: >60 mL/min (ref >60)
Glucose, Bld: 91 mg/dL (ref 70–99)
Potassium: 3.3 mmol/L — ABNORMAL LOW (ref 3.5–5.1)
Sodium: 141 mmol/L (ref 135–145)

## 2023-11-30 LAB — SALICYLATE LEVEL: Salicylate Lvl: <7 mg/dL — ABNORMAL LOW (ref 7.0–30.0)

## 2023-11-30 LAB — ACETAMINOPHEN LEVEL: Acetaminophen (Tylenol), Serum: <10 ug/mL — ABNORMAL LOW (ref 10–30)

## 2023-11-30 LAB — HCG, SERUM, QUALITATIVE: Preg, Serum: NEGATIVE

## 2023-11-30 NOTE — Consult Note (Signed)
[facility] Psychiatric Consult Initial  Patient Name: .Monica Burke  MRN: [id number]  DOB: Mar 15, 2001  Consult Order details:  Orders (From admission, onward)     Start     Ordered   11/30/23 1352  CONSULT TO CALL ACT TEAM       Ordering Provider: Lonell Grandchild, MD  Provider:  (Not yet assigned)  Question:  Reason for Consult?  Answer:  Psych consult   11/30/23 1351             Mode of Visit: In person    Psychiatry Consult Evaluation  Service Date: November 30, 2023 LOS:  LOS: 0 days  Chief Complaint: "my parent's were worried so we came in to get checked out"  Primary Psychiatric Diagnoses  Behavior concern in adult   Assessment   Monica Burke is a 23 y.o. Caucasian adult female with a past reported psychiatric history of GAD, MDD, unspecified mood disorder, ADD, disassociative disorder, bipolar disorder, conversion disorder, with pertinent medical comorbidities/history that include possible/suspected nonepileptic seizures, who presented this encounter by way of EMS, due to concerns by family for the patient having a possible decompensation of her mental health.  Upon evaluation, patient presents with no suicidal and or homicidal ideations, concerns for decompensation into psychosis, nonepileptic seizures, confusion, altered mental status, concerns for lack of capacity, and/or concerns for safety outside of the safe and secure environment in the hospital.  Given the patient presents with appreciable capacity, no concerns for decompensation into psychosis, altered mental status/confusion, and/or present with suicidal or homicidal ideations, recommendation is for psychiatric clearance at this time, as well as the additional recommendations listed below.  Spoke with Dr. Enedina Finner who is in agreement with recommendations listed below.  Diagnoses:  Active Hospital problems: Principal Problem:   Behavior concern in adult    Plan   ## Psychiatric Recommendations:    -Recommend close outpatient follow-up with the patient's outpatient psychiatric provider at the [facility] -Recommend strict adherence to safety plan listed below -Recommend close outpatient follow-up with neurology  Safety Plan Beyza Tomlinson will reach out to Patient's mother, call 911 or call mobile crisis, or go to nearest emergency room if condition worsens or if suicidal thoughts become active Patients' will follow up with [facility] for outpatient psychiatric services (therapy/medication management).  The suicide prevention education provided includes the following: Suicide risk factors Suicide prevention and interventions National Suicide Hotline telephone number [facility] assessment telephone number [facility] Emergency Assistance 911 [facility] and/or Residential Mobile Crisis Unit telephone number Request made of family/significant other to: Patient's mother Remove weapons (e.g., guns, rifles, knives), all items previously/currently identified as safety concern.   Remove drugs/medications (over the counter, prescriptions, illicit drugs), all items previously/currently identified as a safety concern.   ## Medical Decision Making Capacity: Has capacity  ## Further Work-up: None  ## Disposition:-- There are no psychiatric contraindications to discharge at this time  ## Behavioral / Environmental: -None at this time    ## Safety and Observation Level:  - Based on my clinical evaluation, I estimate the patient to be at low risk of self harm in the current setting and upon recommendation for discharge. - At this time, we recommend  routine. This decision is based on my review of the chart including patient's history and current presentation, interview of the patient, mental status examination, and consideration of suicide risk including evaluating suicidal ideation, plan, intent, suicidal or self-harm behaviors, risk factors, and protective factors. This judgment is  based on our ability to directly address suicide risk, implement suicide prevention strategies, and develop a safety plan while the patient is in the clinical setting. Please contact our team if there is a concern that risk level has changed.  CSSR Risk Category:C-SSRS RISK CATEGORY: No Risk  Suicide Risk Assessment: Patient has following modifiable risk factors for suicide: medication noncompliance, which we are addressing by treatment recommendations. Patient has following non-modifiable or demographic risk factors for suicide: history of self harm behavior Patient has the following protective factors against suicide: Access to outpatient mental health care, Supportive family, Supportive friends, Frustration tolerance, and no history of suicide attempts  Thank you for this consult request. Recommendations have been communicated to the primary team.  We will psychiatrically sign off at this time.   Lenox Ponds, NP       History of Present Illness   Monica Burke is a 23 y.o. Caucasian adult female with a past reported psychiatric history of GAD, MDD, unspecified mood disorder, ADD, disassociative disorder, bipolar disorder, conversion disorder, with pertinent medical comorbidities/history that include possible/suspected nonepileptic seizures, who presented this encounter by way of EMS, due to concerns by family for the patient having a possible decompensation of her mental health.  Patient seen today at the [facility] emergency department for face-to-face psychiatric evaluation.  Upon evaluation, patient endorses that she was brought in over concerns from her parents about her mental health, states that more specifically, they were concerned because they state that they witnessed her have a seizure last night, so they wanted to have her checked out.  Patient endorses that she does not remember having a seizure last night, but believes her parents when they say that she did.  Patient  endorses no instability of her mood and her mental health, endorses formally an euthymic mood with a congruent affect, and an appropriate interpersonal style, with fair and good eye contact, appropriate attentiveness, and overall appropriate engagement.  Patient thought process is appreciably goal-directed, linear, and logical.  Patient endorsed no suicidal and or homicidal ideations, denied auditory and or visual hallucinations, and objectively, did not appear to be presenting with psychotic features.  Patient endorsed no psychosocial stressors, states that she just, "sort of just hanging out with my boyfriend".  Patient states she is not in school and/or working, but is looking for work and considering maybe going back to school. Patient orientation is intact, no concerns for fluctuations of consciousness and/or seizure activity.  Patient endorses no drugs, EtOH, and/or tobacco, but does state that she vapes daily; UDS appreciably negative, BAL unremarkable.  Patient endorses she is not on any medications, but was recently on trazodone by her outpatient psychiatric provider at the [facility], states she was using this medicine as needed for some time for sleep, but endorses now she sleeps and eats just fine.  Patient endorses no history of suicide attempts, but endorses a history of self-injurious behavior when she was younger. Patient endorses no inpatient mental health hospitalizations.  Patient endorses no concerns for safety outside of the safe and secure environment. Formal Loni Beckwith performed to evaluate for possible catatonia, no appreciable evidence of catatonia.   Discussed with patient that the recommendation for today would be to follow-up with her outpatient provider closely at the [facility], in addition to strictly hearing to safety plan created today, to which patient verbalized understanding and that she was amenable to this.  Collateral, patient's mother, Ms. Leilani Able, spoken to at  [phone]  Call  placed and extensive conversation held with the patient's mother, who is appreciably and notably also IVC petitioner.  Patient's mother reports that the patient had a seizure-like incident last night which concerned her and her husband, so they petition the patient under involuntary commitment to be brought in for, "to make sure she got treatment." Expanding on her and her husband's concerns, patient's mother reports that they were concerned because after the patient had seizure-like incident last night, states that she presented with confusion and seeming, "off".    Discussed with the patient's mother that this provider's evaluation concluded that there was no evidence of the patient being an imminent risk for self or others, decompensated into psychosis, lacking capacity, and/or presenting in an altered mental state, thus the recommendation for today would be to closely follow-up with the patient's outpatient psychiatric provider, consider following up with neurology,and  to strictly adhere to safety plan created today.    Patient's mother verbalized understanding, no questions or concerns after review of this provider's evaluation, as well as the recommendations given today.  Review of Systems  Neurological:  Negative for dizziness, tremors, seizures, loss of consciousness and headaches.  Psychiatric/Behavioral:  Negative for depression, hallucinations, memory loss, substance abuse and suicidal ideas. The patient is not nervous/anxious and does not have insomnia.   All other systems reviewed and are negative.   Psychiatric and Social History  Psychiatric History:  Information collected from chart review/family  Prev Dx/Sx: GAD, MDD, unspecified mood disorder, ADD, disassociative disorder, bipolar disorder, conversion disorder Current Psych Provider: [facility] Home Meds (current): As needed trazodone Previous Med Trials: Lamictal, trazodone, Zoloft Therapy: [facility]  Prior Psych  Hospitalization: None endorsed or reported Prior Self Harm: History of self-injurious behavior at age of 65, none recently Prior Violence: None endorsed  Family Psych History: None endorsed Family Hx suicide: None endorsed  Social History:  Developmental Hx: WDL Educational Hx: WDL Occupational Hx: Unemployed Legal Hx: None ordered Living Situation: Lives at home Spiritual Hx: None endorsed  Access to weapons/lethal means: None endorsed by patient/family  Substance History Alcohol: Infrequently Type of alcohol None endorsed Last Drink None endorsed Number of drinks per day None endorsed History of alcohol withdrawal seizures None endorsed History of DT's None endorsed Tobacco: Reports vaping daily Illicit drugs: None endorsed Prescription drug abuse: None endorsed Rehab hx: None endorsed  Exam Findings  Physical Exam: As below  Vital Signs:  Temp:  [98.4 F (36.9 C)-99 F (37.2 C)] 99 F (37.2 C) (02/13 0747) Pulse Rate:  [66-78] 66 (02/13 0747) Resp:  [16-18] 18 (02/13 0747) BP: (108-121)/(67-78) 121/67 (02/13 0747) SpO2:  [98 %-100 %] 98 % (02/13 0747) Weight:  [114.3 kg] 114.3 kg (02/12 2232) Blood pressure 121/67, pulse 66, temperature 99 F (37.2 C), temperature source Oral, resp. rate 18, height 5' 5.5" (1.664 m), weight 114.3 kg, last menstrual period 11/29/2023, SpO2 98%. Body mass index is 41.29 kg/m.  Physical Exam Vitals and nursing note reviewed.  Constitutional:      General: Skylyn is not in acute distress.    Appearance: Sura is obese. Charlee is not ill-appearing, toxic-appearing or diaphoretic.     Comments: Normal interpersonal style  Pulmonary:     Effort: Pulmonary effort is normal.  Skin:    General: Skin is warm and dry.  Neurological:     Mental Status: Chauntel is alert and oriented to person, place, and time.     Motor: No weakness, tremor or seizure activity.  Psychiatric:  Attention and Perception: Attention and perception  normal. Desera is attentive.        Mood and Affect: Mood and affect normal.        Speech: Speech normal.        Behavior: Behavior is not agitated, slowed, aggressive, withdrawn, hyperactive or combative. Behavior is cooperative.        Thought Content: Thought content normal. Thought content is not paranoid or delusional. Thought content does not include homicidal or suicidal ideation.        Cognition and Memory: Cognition and memory normal.        Judgment: Judgment normal.   Mental Status Exam: General Appearance: Well Groomed  Orientation:  Full (Time, Place, and Person)  Memory:  Immediate;   Good Recent;   Fair Remote;   Good  Concentration:  Concentration: Fair and Attention Span: Fair  Recall:  Fair  Attention  Fair  Eye Contact:  Good  Speech:  Clear and Coherent  Language:  Good  Volume:  Normal  Mood: Euthymic  Affect:  Appropriate  Thought Process:  Coherent, Goal Directed, and Linear  Thought Content:  Logical  Suicidal Thoughts:  No  Homicidal Thoughts:  No  Judgement:  Intact  Insight:  Fair  Psychomotor Activity:  Normal  Akathisia:  No  Fund of Knowledge:  Fair      Assets:  Manufacturing systems engineer Desire for Improvement Financial Resources/Insurance Housing Intimacy Leisure Time Physical Health Resilience Social Support Talents/Skills Transportation Vocational/Educational  Cognition:  WNL  ADL's:  Intact  AIMS (if indicated):   0     Other History   These have been pulled in through the EMR, reviewed, and updated if appropriate.  Family History:  The patient's family history includes Alcohol abuse in Addelyn's maternal grandfather, mother, and paternal grandmother; Anxiety disorder in Jaslyne's maternal grandmother, mother, and sister; Depression in Rana's maternal grandmother, mother, and sister; Diabetes type I in Aaniya's mother; Drug abuse in Nissa's father, maternal grandfather, maternal grandmother, and mother; Heart disease in Dalyce's  maternal grandfather and mother; Hyperlipidemia in Oumou's maternal grandfather, mother, and paternal grandmother; Hypertension in Artemis's maternal grandfather.  Medical History: Past Medical History:  Diagnosis Date   ADHD (attention deficit hyperactivity disorder)    Anxiety    Bipolar disorder (HCC)    Conversion disorder    Depression    Dissociative disorder    Eczema    Self mutilating behavior     Surgical History: History reviewed. No pertinent surgical history.   Medications:  No current facility-administered medications for this encounter.  Current Outpatient Medications:    traZODone (DESYREL) 50 MG tablet, Take 1 tablet (50 mg total) by mouth at bedtime. (Patient taking differently: Take 50 mg by mouth at bedtime as needed for sleep.), Disp: 30 tablet, Rfl: 0  Allergies: Allergies  Allergen Reactions   Wellbutrin [Bupropion] Other (See Comments)    Seizures    Latex Rash    Lenox Ponds, NP

## 2023-11-30 NOTE — ED Provider Notes (Signed)
Psychiatry reports the patient is psychiatrically her for discharge home.  I went to assess the patient and she is eating dinner comfortably without complaints.  Will discharge per plan.  Clinical Impression: 1. Bizarre behavior   2. Seizure-like activity (HCC)     Disposition: Discharge  Condition: Good  I have discussed the results, Dx and Tx plan with the pt(& family if present). He/she/they expressed understanding and agree(s) with the plan. Discharge instructions discussed at great length. Strict return precautions discussed and pt &/or family have verbalized understanding of the instructions. No further questions at time of discharge.    New Prescriptions   No medications on file    Follow Up: No follow-up provider specified.    Cameryn Chrisley, Canary Brim, MD 11/30/23 1816

## 2023-11-30 NOTE — ED Notes (Signed)
Belonging in [place] [facility]

## 2023-11-30 NOTE — Discharge Instructions (Signed)
Recommend close outpatient follow-up with the patient's outpatient psychiatric provider at the [facility] Recommend strict adherence to safety plan listed below Recommend close outpatient follow-up with neurology   Safety Plan [facility] will reach out to Patient's mother, call 911 or call mobile crisis, or go to nearest emergency room if condition worsens or if suicidal thoughts become active Patients' will follow up with [facility] for outpatient psychiatric services (therapy/medication management).  The suicide prevention education provided includes the following: Suicide risk factors Suicide prevention and interventions National Suicide Hotline telephone number [facility] assessment telephone number [facility] Emergency Assistance 911 [facility] and/or Residential Mobile Crisis Unit telephone number Request made of family/significant other to: Patient's mother Remove weapons (e.g., guns, rifles, knives), all items previously/currently identified as safety concern.   Remove drugs/medications (over the counter, prescriptions, illicit drugs), all items previously/currently identified as a safety concern.

## 2023-11-30 NOTE — Telephone Encounter (Signed)
Patients mother is calling to inform you that the patient was taken to [facility] ED last night for what she called "a psychotic break and six minute seizure". Patient has been in the ED since 10:30 last night and has not been seen yet. Patients mother wanted you aware.

## 2023-11-30 NOTE — ED Provider Notes (Signed)
[facility] AT [facility] Provider Note  CSN: [id number] Arrival date & time: 11/29/23 2227  Chief Complaint(s) Seizures  HPI Monica Burke is a 23 y.o. 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  Patient has a history of suspected nonepileptic seizure and then had a shaking activity at home.  Patient reports she does not remember this.  There is reportedly some confusion.  Patient is now with father who reports patient seems "off".  Patient denies any complaints.  Father reports that she has not been sleeping much.  No headaches, fevers or chills, abdominal pain, chest pain.   Past Medical History Past Medical History:  Diagnosis Date   ADHD (attention deficit hyperactivity disorder)    Anxiety    Bipolar disorder (HCC)    Conversion disorder    Depression    Dissociative disorder    Eczema    Self mutilating behavior    Patient Active Problem List   Diagnosis Date Noted   COVID-19    Seizure-like activity (HCC) 01/30/2021   Severe recurrent major depression without psychotic features (HCC) 01/06/2021   Altered mental status 01/06/2021   Attention deficit hyperactivity disorder (ADHD), combined type 07/30/2019   Generalized anxiety disorder 07/30/2019   Depression in pediatric patient 07/30/2019   History of self injurious behavior 07/30/2019   Social anxiety disorder 07/30/2019   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 Authorizing Provider  cetirizine (ZYRTEC ALLERGY) 10 MG tablet Take 1 tablet (10 mg total) by mouth at bedtime. 10/24/22 04/22/23  Theadora Rama Scales, PA-C  chlorhexidine (PERIDEX) 0.12 % solution Use as directed 15 mLs in the mouth or throat 2 (two) times daily. 01/26/22   Theadora Rama Scales, PA-C   fexofenadine (ALLEGRA) 180 MG tablet Take 1 tablet (180 mg total) by mouth daily. 10/24/22 04/22/23  Theadora Rama Scales, PA-C  fluticasone (FLONASE) 50 MCG/ACT nasal spray Place 1 spray into both nostrils daily. 10/24/22   Theadora Rama Scales, PA-C  lamoTRIgine (LAMICTAL) 25 MG tablet Take 75 mg by mouth daily. 12/23/20   [provider]  levocetirizine (XYZAL) 5 MG tablet Take 1 tablet (5 mg total) by mouth every evening. 10/24/22 04/22/23  Theadora Rama Scales, PA-C  mometasone (NASONEX) 50 MCG/ACT nasal spray Place 2 sprays into the nose daily. 10/24/22 01/22/23  Theadora Rama Scales, PA-C  nystatin (MYCOSTATIN) 100000 UNIT/ML suspension Take 5 mLs (500,000 Units total) by mouth 4 (four) times daily. 01/26/22   Theadora Rama Scales, PA-C  traZODone (DESYREL) 50 MG tablet Take 1 tablet (50 mg total) by mouth at bedtime. 11/16/23   Oneta Rack, NP  buPROPion (WELLBUTRIN XL) 150 MG 24 hr tablet Take 150 mg by mouth daily. 12/23/20 01/06/21  [provider]  Past Surgical History History reviewed. No pertinent surgical history. Family History Family History  Problem Relation Age of Onset   Anxiety disorder Mother    Depression Mother    Diabetes type I Mother    Hyperlipidemia Mother    Heart disease Mother    Alcohol abuse Mother    Drug abuse Mother    Drug abuse Father    Anxiety disorder Sister    Depression Sister    Anxiety disorder Maternal Grandmother    Depression Maternal Grandmother    Drug abuse Maternal Grandmother    Heart disease Maternal Grandfather    Hyperlipidemia Maternal Grandfather    Hypertension Maternal Grandfather    Alcohol abuse Maternal Grandfather    Drug abuse Maternal Grandfather    Hyperlipidemia Paternal Grandmother    Alcohol abuse Paternal Grandmother     Social History Social History   Tobacco Use    Smoking status: Never   Smokeless tobacco: Never  Vaping Use   Vaping status: Every Day  Substance Use Topics   Alcohol use: Yes   Drug use: No   Allergies Wellbutrin [bupropion] and Latex  Review of Systems Review of Systems  All other systems reviewed and are negative.   Physical Exam Vital Signs  I have reviewed the triage vital signs BP 121/67   Pulse 66   Temp 99 F (37.2 C) (Oral)   Resp 18   Ht 5' 5.5" (1.664 m)   Wt 114.3 kg   LMP 11/29/2023 (Exact Date)   SpO2 98%   BMI 41.29 kg/m  Physical Exam Vitals and nursing note reviewed.  Constitutional:      General: Monica Burke is not in acute distress.    Appearance: Normal appearance.  HENT:     Mouth/Throat:     Mouth: Mucous membranes are moist.  Eyes:     Conjunctiva/sclera: Conjunctivae normal.  Cardiovascular:     Rate and Rhythm: Normal rate and regular rhythm.  Pulmonary:     Effort: Pulmonary effort is normal. No respiratory distress.     Breath sounds: Normal breath sounds.  Abdominal:     General: Abdomen is flat.     Palpations: Abdomen is soft.     Tenderness: There is no abdominal tenderness.  Musculoskeletal:     Cervical back: Neck supple. No rigidity.     Right lower leg: No edema.     Left lower leg: No edema.  Skin:    General: Skin is warm and dry.     Capillary Refill: Capillary refill takes less than 2 seconds.  Neurological:     General: No focal deficit present.     Mental Status: Monica Burke is alert.     Comments: Oriented to name, when asked other orientation questions patient also replies "[facility]", however she will answer other questions normally.  No clonus, strength deficit, cranial nerve abnormality  Psychiatric:        Mood and Affect: Mood normal.     Comments: Questionably reacting to internal stimuli     ED Results and Treatments Labs (all labs ordered are listed, but only abnormal results are displayed) Labs Reviewed  CBC WITH DIFFERENTIAL/PLATELET - Abnormal;  Notable for the following components:      Result Value   Neutro Abs 8.6 (*)    All other components within normal limits  BASIC METABOLIC PANEL - Abnormal; Notable for the following components:   Potassium 3.3 (*)    Calcium 8.1 (*)    All  other components within normal limits  ACETAMINOPHEN LEVEL - Abnormal; Notable for the following components:   Acetaminophen (Tylenol), Serum <10 (*)    All other components within normal limits  SALICYLATE LEVEL - Abnormal; Notable for the following components:   Salicylate Lvl <7.0 (*)    All other components within normal limits  URINALYSIS, W/ REFLEX TO CULTURE (INFECTION SUSPECTED) - Abnormal; Notable for the following components:   APPearance HAZY (*)    Hgb urine dipstick MODERATE (*)    Bacteria, UA FEW (*)    All other components within normal limits  HCG, SERUM, QUALITATIVE  RAPID URINE DRUG SCREEN, HOSP PERFORMED                                                                                                                          Radiology CT Head Wo Contrast Result Date: 11/30/2023 CLINICAL DATA:  23 year old female with seizure like activity. Altered mental status. EXAM: CT HEAD WITHOUT CONTRAST TECHNIQUE: Contiguous axial images were obtained from the base of the skull through the vertex without intravenous contrast. RADIATION DOSE REDUCTION: This exam was performed according to the departmental dose-optimization program which includes automated exposure control, adjustment of the mA and/or kV according to patient size and/or use of iterative reconstruction technique. COMPARISON:  Neck CT 02/20/2015. FINDINGS: Brain: Normal cerebral volume. No midline shift, ventriculomegaly, mass effect, evidence of mass lesion, intracranial hemorrhage or evidence of cortically based acute infarction. Gray-white matter differentiation is within normal limits throughout the brain. Normal basilar cisterns. Vascular: No suspicious intracranial vascular  hyperdensity. Skull: Intact, negative. Sinuses/Orbits: Visualized paranasal sinuses and mastoids are clear. Other: Left forehead scalp piercing incidentally noted. Visualized orbits and scalp soft tissues are within normal limits. IMPRESSION: Normal noncontrast Head CT. Electronically Signed   By: Odessa Fleming M.D.   On: 11/30/2023 13:06    Pertinent labs & imaging results that were available during my care of the patient were reviewed by me and considered in my medical decision making (see MDM for details).  Medications Ordered in ED Medications - No data to display                                                                                                                                   Procedures Procedures  (including critical care time)  Medical Decision Making / ED Course   MDM:  23 year old presenting to the  emergency department with shaking.  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  She was admitted to pediatric unit prior for this, had video EEG which did not capture any episodes, but was ultimately felt by pediatric neurology to be a nonepileptic event.  No further seizure like activity since yesterday.  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  Possibly also with underlying psychiatric disorder.  Patient somewhat appears to be reacting to internal stimuli.  She is acting somewhat unusually, replying her name to non-name related orientation questions, however she will answer other questions.  Obtain CT head, other psychiatric screening labs.  Low concern for toxidrome, CNS infection, nonconvulsive status epilepticus, intracranial bleeding, or other dangerous process.  Will reassess.  Clinical Course as of 11/30/23 1354  Thu Nov 30, 2023  0947 Lymphocytes: 12 [WS]  1350 Workup is overall unremarkable.  CT head negative.  Suspect underlying cause of the patient's symptoms is probably psychiatric.  The patient's mother placed an  IVC on the patient as she was concerned of an underlying psychiatric cause. Patient also not compliant with her medications. I think patient is medically cleared for psychiatric evaluation.  Will place consultation for psychiatric services  [WS]    Clinical Course User Index [WS] Suezanne Jacquet, Jerilee Field, MD     Additional history obtained: -Additional history obtained from family -External records from outside source obtained and reviewed including: Chart review including previous notes, labs, imaging, consultation notes including prior notes    Lab Tests: -I ordered, reviewed, and interpreted labs.   The pertinent results include:   Labs Reviewed  CBC WITH DIFFERENTIAL/PLATELET - Abnormal; Notable for the following components:      Result Value   Neutro Abs 8.6 (*)    All other components within normal limits  BASIC METABOLIC PANEL - Abnormal; Notable for the following components:   Potassium 3.3 (*)    Calcium 8.1 (*)    All other components within normal limits  ACETAMINOPHEN LEVEL - Abnormal; Notable for the following components:   Acetaminophen (Tylenol), Serum <10 (*)    All other components within normal limits  SALICYLATE LEVEL - Abnormal; Notable for the following components:   Salicylate Lvl <7.0 (*)    All other components within normal limits  URINALYSIS, W/ REFLEX TO CULTURE (INFECTION SUSPECTED) - Abnormal; Notable for the following components:   APPearance HAZY (*)    Hgb urine dipstick MODERATE (*)    Bacteria, UA FEW (*)    All other components within normal limits  HCG, SERUM, QUALITATIVE  RAPID URINE DRUG SCREEN, HOSP PERFORMED    Notable for mild hypokalemia, negative pregnancy  Imaging Studies ordered: I ordered imaging studies including CT head On my interpretation imaging demonstrates no acute process I independently visualized and interpreted imaging. I agree with the radiologist interpretation   Medicines ordered and prescription drug management: No  orders of the defined types were placed in this encounter.   -I have reviewed the patients home medicines and have made adjustments as needed   Social Determinants of Health:  Diagnosis or treatment significantly limited by social determinants of health: obesity   Reevaluation: After the interventions noted above, I reevaluated the patient and found that their symptoms have stayed the same  Co morbidities that complicate the patient evaluation  Past Medical History:  Diagnosis Date   ADHD (attention deficit hyperactivity disorder)    Anxiety    Bipolar disorder (HCC)    Conversion disorder    Depression  Dissociative disorder    Eczema    Self mutilating behavior       Dispostion: Disposition decision including need for hospitalization was considered, and patient boarding for psychiatric evaluation    Final Clinical Impression(s) / ED Diagnoses Final diagnoses:  Bizarre behavior  Seizure-like activity (HCC)     This chart was dictated using voice recognition software.  Despite best efforts to proofread,  errors can occur which can change the documentation meaning.    Lonell Grandchild, MD 11/30/23 [phone]

## 2023-12-01 ENCOUNTER — Encounter: Payer: Self-pay | Admitting: Neurology

## 2023-12-05 ENCOUNTER — Ambulatory Visit (HOSPITAL_COMMUNITY): Payer: BC Managed Care – PPO | Admitting: Family

## 2023-12-07 ENCOUNTER — Ambulatory Visit (HOSPITAL_COMMUNITY): Payer: BC Managed Care – PPO | Admitting: Clinical

## 2024-01-16 ENCOUNTER — Ambulatory Visit: Payer: BC Managed Care – PPO | Admitting: Neurology

## 2024-02-07 ENCOUNTER — Ambulatory Visit (HOSPITAL_COMMUNITY): Admitting: Family

## 2024-03-06 ENCOUNTER — Ambulatory Visit: Payer: Self-pay | Admitting: Nurse Practitioner

## 2024-03-06 ENCOUNTER — Ambulatory Visit
Admission: EM | Admit: 2024-03-06 | Discharge: 2024-03-06 | Disposition: A | Discharge status: Home / Self Care | Attending: Unknown Physician Specialty | Admitting: Unknown Physician Specialty

## 2024-03-06 ENCOUNTER — Other Ambulatory Visit: Payer: Self-pay

## 2024-03-06 ENCOUNTER — Ambulatory Visit (INDEPENDENT_AMBULATORY_CARE_PROVIDER_SITE_OTHER)

## 2024-03-06 DIAGNOSIS — S66911A Strain of unspecified muscle, fascia and tendon at wrist and hand level, right hand, initial encounter: Secondary | ICD-10-CM | POA: Diagnosis not present

## 2024-03-06 DIAGNOSIS — M25531 Pain in right wrist: Secondary | ICD-10-CM

## 2024-03-06 MED ORDER — METHYLPREDNISOLONE 4 MG PO TBPK: ORAL_TABLET | ORAL | 0 refills | Status: DC

## 2024-03-06 NOTE — ED Provider Notes (Signed)
 [facility]    CSN: [id number] Arrival date & time: 03/06/24  1250      History   Chief Complaint No chief complaint on file.   HPI Monica Burke is a 23 y.o. adult presents for right wrist pain.  Patient reports 1 month of a persistent lateral right wrist pain that is worse with flexion extension and gripping.  Does endorse some numbness or tingling in her 2nd through 4th fingers intermittently.  Denies any known injury or known inciting event.  States it does seem to swell at times but no bruising.  No history of injuries or surgeries to the wrist in the past.  She tried an over-the-counter wrist brace but felt it made her pain worse.  No other concerns at this time.  HPI  Past Medical History:  Diagnosis Date   ADHD (attention deficit hyperactivity disorder)    Anxiety    Bipolar disorder (HCC)    Conversion disorder    Depression    Dissociative disorder    Eczema    Self mutilating behavior     Patient Active Problem List   Diagnosis Date Noted   Behavior concern in adult 11/30/2023   COVID-19    Seizure-like activity (HCC) 01/30/2021   Severe recurrent major depression without psychotic features (HCC) 01/06/2021   Altered mental status 01/06/2021   Attention deficit hyperactivity disorder (ADHD), combined type 07/30/2019   Generalized anxiety disorder 07/30/2019   Depression in pediatric patient 07/30/2019   History of self injurious behavior 07/30/2019   Social anxiety disorder 07/30/2019   Separation anxiety disorder 07/30/2019    History reviewed. No pertinent surgical history.  OB History   No obstetric history on file.      Home Medications    Prior to Admission medications   Medication Sig Start Date End Date Taking? Authorizing Provider  methylPREDNISolone  (MEDROL  DOSEPAK) 4 MG TBPK tablet Take as prescribed on package 03/06/24  Yes Elisabel Hanover, Jodi R, NP  traZODone  (DESYREL ) 50 MG tablet Take 1 tablet (50 mg total) by mouth at  bedtime. Patient taking differently: Take 50 mg by mouth at bedtime as needed for sleep. 11/16/23   Levester Reagin, NP  buPROPion  (WELLBUTRIN  XL) 150 MG 24 hr tablet Take 150 mg by mouth daily. 12/23/20 01/06/21  [provider]    Family History Family History  Problem Relation Age of Onset   Anxiety disorder Mother    Depression Mother    Diabetes type I Mother    Hyperlipidemia Mother    Heart disease Mother    Alcohol abuse Mother    Drug abuse Mother    Drug abuse Father    Anxiety disorder Sister    Depression Sister    Anxiety disorder Maternal Grandmother    Depression Maternal Grandmother    Drug abuse Maternal Grandmother    Heart disease Maternal Grandfather    Hyperlipidemia Maternal Grandfather    Hypertension Maternal Grandfather    Alcohol abuse Maternal Grandfather    Drug abuse Maternal Grandfather    Hyperlipidemia Paternal Grandmother    Alcohol abuse Paternal Grandmother     Social History Social History   Tobacco Use   Smoking status: Never   Smokeless tobacco: Never  Vaping Use   Vaping status: Every Day   Substances: Nicotine  Substance Use Topics   Alcohol use: Not Currently   Drug use: No     Allergies   Wellbutrin  [bupropion ] and Latex   Review of Systems Review  of Systems  Musculoskeletal:        Right wrist pain      Physical Exam Triage Vital Signs ED Triage Vitals  Encounter Vitals Group     BP 03/06/24 1302 125/87     Systolic BP Percentile --      Diastolic BP Percentile --      Pulse Rate 03/06/24 1302 82     Resp 03/06/24 1302 16     Temp 03/06/24 1302 98 F (36.7 C)     Temp Source 03/06/24 1302 Oral     SpO2 03/06/24 1302 96 %     Weight --      Height --      Head Circumference --      Peak Flow --      Pain Score 03/06/24 1301 0     Pain Loc --      Pain Education --      Exclude from Growth Chart --    No data found.  Updated Vital Signs BP 125/87   Pulse 82   Temp 98 F (36.7 C) (Oral)    Resp 16   SpO2 96%   Visual Acuity Right Eye Distance:   Left Eye Distance:   Bilateral Distance:    Right Eye Near:   Left Eye Near:    Bilateral Near:     Physical Exam Vitals and nursing note reviewed.  Constitutional:      Appearance: Normal appearance.  HENT:     Head: Normocephalic and atraumatic.  Eyes:     Pupils: Pupils are equal, round, and reactive to light.  Cardiovascular:     Rate and Rhythm: Normal rate.  Pulmonary:     Effort: Pulmonary effort is normal.  Musculoskeletal:     Right wrist: Tenderness and bony tenderness present. No swelling, deformity, effusion, lacerations, snuff box tenderness or crepitus. Decreased range of motion. Normal pulse.     Comments: Tender to palpation to the lateral right wrist without swelling.  Negative Phalen and Tinel test.  Pain with dorsi flexion and extension of the wrist.  Grip strength 4 out of 5 on right, 5 out of 5 on left  Skin:    General: Skin is warm and dry.  Neurological:     General: No focal deficit present.     Mental Status: Sarye is alert and oriented to person, place, and time.  Psychiatric:        Mood and Affect: Mood normal.        Behavior: Behavior normal.      UC Treatments / Results  Labs (all labs ordered are listed, but only abnormal results are displayed) Labs Reviewed - No data to display  EKG   Radiology No results found.  Procedures Procedures (including critical care time)  Medications Ordered in UC Medications - No data to display  Initial Impression / Assessment and Plan / UC Course  I have reviewed the triage vital signs and the nursing notes.  Pertinent labs & imaging results that were available during my care of the patient were reviewed by me and considered in my medical decision making (see chart for details).     Reviewed exam and symptoms with patient.  No red flags.  Wet read of x-ray without obvious fracture, will contact for any positive results based on  radiology overread.  Discussed likely wrist strain.  Patient fitted with wrist brace and will start Medrol  Dosepak.  Discussed RICE therapy and PCP or  [facility] follow-up if symptoms do not improve.  ER precautions reviewed and patient verbalized understanding. Final Clinical Impressions(s) / UC Diagnoses   Final diagnoses:  Right wrist pain  Strain of right wrist, initial encounter     Discharge Instructions      You may use the wrist brace as needed to help support and stabilize your wrist.  You may elevate and ice as needed.  Start the steroid Dosepak as prescribed.  Please follow-up with your PCP or or [facility] for further workup if your symptoms do not improve.  Please go to the ER for any worsening symptoms.  Hope you feel better soon!   ED Prescriptions     Medication Sig Dispense Auth. Provider   methylPREDNISolone  (MEDROL  DOSEPAK) 4 MG TBPK tablet Take as prescribed on package 21 tablet Mariyanna Mucha, Jodi R, NP      PDMP not reviewed this encounter.   Alleen Arbour, NP 03/06/24 1346

## 2024-03-06 NOTE — Discharge Instructions (Addendum)
 You may use the wrist brace as needed to help support and stabilize your wrist.  You may elevate and ice as needed.  Start the steroid Dosepak as prescribed.  Please follow-up with your PCP or or [facility] for further workup if your symptoms do not improve.  Please go to the ER for any worsening symptoms.  Hope you feel better soon!

## 2024-03-06 NOTE — ED Triage Notes (Signed)
 Pt c/o right wrist painxmo. Pt states she can't brush her hair and use her wrist w/o pain. Pt c/o int numbness in knuckles of finger. Pt has 2+ right radial pulse, cap refill less than 3 sec, warm to touch, 5/5 right grip strength. Pt states sometimes the pain radiates to he right elbow. Pt denies injury

## 2024-03-20 ENCOUNTER — Ambulatory Visit: Admitting: Neurology

## 2024-03-20 ENCOUNTER — Encounter: Payer: Self-pay | Admitting: Neurology

## 2024-03-20 VITALS — BP 100/70 | HR 80 | Ht 65.5 in | Wt 267.8 lb

## 2024-03-20 DIAGNOSIS — R569 Unspecified convulsions: Secondary | ICD-10-CM | POA: Diagnosis not present

## 2024-03-20 DIAGNOSIS — R404 Transient alteration of awareness: Secondary | ICD-10-CM | POA: Diagnosis not present

## 2024-03-20 NOTE — Progress Notes (Signed)
 NEUROLOGY CONSULTATION NOTE  Monica Burke MRN: [id number] DOB: Jul 04, 2001  Referring provider: Dr. Veryl Gottron Tegeler (ER) Primary care provider: none listed  Reason for consult:  seizure-like activity  Dear Dr Manus Sellers:  Thank you for your kind referral of Monica Burke for consultation of the above symptoms. Although their history is well known to you, please allow me to reiterate it for the purpose of our medical record. The patient was accompanied to the clinic by their mother and significant other Eli who also provide collateral information. Records and images were personally reviewed where available.   HISTORY OF PRESENT ILLNESS: This is a 23 year old right-handed woman with a history of anxiety, depression, bipolar disorder, dissociative disorder, presenting for evaluation of seizure-like activity. Records were reviewed. The first event occurred in 12/2020, a week after abruptly stopping Zoloft  and Vyvanse  and then starting Wellbutrin , she started feeling strange and had seizure-like activity. Wellbutrin  was discontinued. Routine EEG in 12/2020 was normal. She was back in the ER in 01/2021 for abnormal eye movements, zoning out, "losing train of thought." Overnight EEG was normal, typical events were not captured. She was evaluated by pediatric neurologist Dr. 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 She reports dissociating quite frequently (2-3 times a month). When dissociating, she feels like she is watching herself from outside, "like body is separate from my mind," with a belt around her body.  Her stomach hurts afterwards. When she feels this way, she usually lays down and sleeps. If mild, she tries to relax and symptoms subside. She went one year without events until 2023 while she was in [place] and was very dissociated when she came home on the bus.  They report that episodes occur once a year but are becoming more  and more violent. On 11/30/23, she had "the worst and longest one." Eli reports that she was out of it all day long with eyes fluttering, unable to hear him. This lasted until she went to sleep at 7pm, then 15 minutes later she jumped up, turned around "freaking out" and had a gurgling scream, violently shaking for 6 minutes. With prior episodes, her body completely seizures up, but with this one it was almost like she was awake and trying to wake up. Stomach bile was coming up, she did not recognize family, full force grabbing for things, scared. They had to lay her on her back. She does not remember much of it, stating she does not know what is going on with most of them. She had not slept well the night prior, which is usually a trigger, she wakes up "not fully conscious." As long as she does not stress about inability to sleep, it does not bother her. Anxiety seems to set it off. She states the dissociation does not necessarily occur when she feels stressed out. After the episode, she felt like she just ran a marathon, physically tired. In the ER, she kept trying to stand up but would fall back down, she kept urinating on herself (they report she urinated on herself 6 times), "like a toddler uncontrolled." Eli reports she was "not acting right" up to 20 hours later, which is longer than usual, only recognizing her father. No further bigger episodes since 11/2023. The last time she dissociated was this morning lasting an hour, knowing she was coming for today's appointment.   She denies any olfactory/gustatory hallucinations, deja vu, rising epigastric sensation, myoclonic jerks. She has hypnic  jerks in sleep. She denies any headaches, dizziness, diplopia, dysarthria/dysphagia, neck pain, bowel/bladder dysfunction. She has back pain, right hip is inverted. She has shooting pain from the right wrist to her elbow, waking up with hand numb sometimes, unable to turn it. Memory is good. She continues to deal with  insomnia, Trazodone  started by Psychiatry has not helped. She reports "Klonopin  is the only thing that makes me relax." She lives with her parents and Kelle Pate. She drives occasionally. 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   She has had head injuries with no loss of consciousness. She had a normal birth and early development.  There is no history of febrile convulsions, CNS infections such as meningitis/encephalitis, significant traumatic brain injury, neurosurgical procedures, or family history of seizures.    PAST MEDICAL HISTORY: Past Medical History:  Diagnosis Date   ADHD (attention deficit hyperactivity disorder)    Anxiety    Bipolar disorder (HCC)    Conversion disorder    Depression    Dissociative disorder    Eczema    Self mutilating behavior     PAST SURGICAL HISTORY: History reviewed. No pertinent surgical history.  MEDICATIONS: Current Outpatient Medications on File Prior to Visit  Medication Sig Dispense Refill   [DISCONTINUED] buPROPion  (WELLBUTRIN  XL) 150 MG 24 hr tablet Take 150 mg by mouth daily. (Patient not taking: Reported on 03/20/2024)     No current facility-administered medications on file prior to visit.    ALLERGIES: Allergies  Allergen Reactions   Wellbutrin  [Bupropion ] Other (See Comments)    Seizures    Latex Rash    FAMILY HISTORY: Family History  Problem Relation Age of Onset   Anxiety disorder Mother    Depression Mother    Diabetes type I Mother    Hyperlipidemia Mother    Heart disease Mother    Alcohol abuse Mother    Drug abuse Mother    Drug abuse Father    Anxiety disorder Sister    Depression Sister    Anxiety disorder Maternal Grandmother    Depression Maternal Grandmother    Drug abuse Maternal Grandmother    Heart disease Maternal Grandfather    Hyperlipidemia Maternal Grandfather    Hypertension Maternal Grandfather    Alcohol abuse Maternal Grandfather    Drug  abuse Maternal Grandfather    Hyperlipidemia Paternal Grandmother    Alcohol abuse Paternal Grandmother     SOCIAL HISTORY: Social History   Socioeconomic History   Marital status: Single    Spouse name: Not on file   Number of children: Not on file   Years of education: Not on file   Highest education level: Not on file  Occupational History   Not on file  Tobacco Use   Smoking status: Never   Smokeless tobacco: Never  Vaping Use   Vaping status: Every Day   Substances: Nicotine  Substance and Sexual Activity   Alcohol use: Not Currently   Drug use: No   Sexual activity: Never  Other Topics Concern   Not on file  Social History Narrative   Are you right handed or left handed? Right    Are you currently employed ? Yes    What is your current occupation? Host   Do you live at home alone?no    Who lives with you? Lives with family    What type of home do you live in: 1 story  or 2 story? 1 story        Social Drivers of Corporate investment banker Strain: Not on file  Food Insecurity: Not on file  Transportation Needs: Not on file  Physical Activity: Not on file  Stress: Not on file  Social Connections: Not on file  Intimate Partner Violence: Not on file     PHYSICAL EXAM: Vitals:   03/20/24 1348  BP: 100/70  Pulse: 80  SpO2: 98%   General: No acute distress Head:  Normocephalic/atraumatic Skin/Extremities: No rash, no edema Neurological Exam: Mental status: alert and oriented to person, place, and time, no dysarthria or aphasia, Fund of knowledge is appropriate.  Recent and remote memory are intact, 3/3 delayed recall.  Attention and concentration are normal, 5/5 WORLD backwards. Cranial nerves: CN I: not tested CN II: pupils equal, round, visual fields intact CN III, IV, VI:  full range of motion, no nystagmus, no ptosis CN V: facial sensation intact CN VII: upper and lower face symmetric CN VIII: hearing intact to conversation Bulk & Tone: normal, no  fasciculations. Motor: 5/5 throughout with no pronator drift. Sensation: intact to light touch, cold, pin, vibration sense.  No extinction to double simultaneous stimulation.  Romberg test negative Deep Tendon Reflexes: +2 throughout Cerebellar: no incoordination on finger to nose testing Gait: narrow-based and steady, able to tandem walk adequately. Tremor: none   IMPRESSION: This is a 23 year old right-handed woman with a history of anxiety, depression, bipolar disorder, dissociative disorder, presenting for evaluation of seizure-like activity. She has had prolonged episodes of dissociation, and infrequent episodes of convulsive activity. She has been diagnosed with Conversion disorder in the past. She had a 24-hour EEG in 2022 however typical events were not captured. We discussed different types of seizures, including Functional (ie Psychogenic non-epileptic) seizures. MRI brain with and without contrast and EEG will be ordered. If normal, we will do a 72-hour EEG to further characterize events.  [place] driving laws were discussed with the patient, and she knows to stop driving after an episode of loss of awareness until 6 months event-free. Continue follow-up with Behavioral Health. Follow-up after tests, call for any changes.   Thank you for allowing me to participate in the care of this patient. Please do not hesitate to call for any questions or concerns.   Rayfield Cairo, M.D.  CC: Dr. Tegeler

## 2024-03-20 NOTE — Patient Instructions (Signed)
 Good to meet you.  Schedule MRI brain with and without contrast  2. Schedule 1-hour EEG. If normal, we will do a 3-day home EEG  3. Follow-up after tests, call for any changes

## 2024-04-03 ENCOUNTER — Ambulatory Visit: Admitting: Neurology

## 2024-04-03 DIAGNOSIS — R404 Transient alteration of awareness: Secondary | ICD-10-CM | POA: Diagnosis not present

## 2024-04-03 DIAGNOSIS — R569 Unspecified convulsions: Secondary | ICD-10-CM

## 2024-04-03 NOTE — Progress Notes (Signed)
 EEG complete and ready for review.

## 2024-04-03 NOTE — Procedures (Signed)
 ELECTROENCEPHALOGRAM REPORT  Date of Study: 04/03/2024  Patient's Name: Monica Burke MRN: [id number] Date of Birth: November 11, 2000  Referring Provider: Dr. Rayfield Cairo  Clinical History: This is a 23 year old woman with episodes of zoning out, dissociation, and convulsions. EEG for classification.  Medications: none  Technical Summary: A multichannel digital 1-hour EEG recording measured by the international 10-20 system with electrodes applied with paste and impedances below 5000 ohms performed in our laboratory with EKG monitoring in an awake patient.  Hyperventilation and photic stimulation were performed.  The digital EEG was referentially recorded, reformatted, and digitally filtered in a variety of bipolar and referential montages for optimal display.    Description: The patient is awake during the recording.  During maximal wakefulness, there is a symmetric, medium voltage 10 Hz posterior dominant rhythm that attenuates with eye opening.  The record is symmetric. Sleep was not captured. Hyperventilation and photic stimulation did not elicit any abnormalities.  There were no epileptiform discharges or electrographic seizures seen.    EKG lead was unremarkable.  Impression: This 1-hour awake EEG is normal.    Clinical Correlation: A normal EEG does not exclude a clinical diagnosis of epilepsy.  If further clinical questions remain, prolonged EEG may be helpful.  Clinical correlation is advised.   Rayfield Cairo, M.D.

## 2024-04-04 ENCOUNTER — Ambulatory Visit: Payer: Self-pay | Admitting: Neurology

## 2024-04-04 DIAGNOSIS — R404 Transient alteration of awareness: Secondary | ICD-10-CM

## 2024-04-04 DIAGNOSIS — R569 Unspecified convulsions: Secondary | ICD-10-CM

## 2024-04-04 NOTE — Telephone Encounter (Signed)
-----   Message from Jhonny Moss sent at 04/04/2024 10:04 AM EDT ----- Pls let her know EEG is normal, proceed with 3-day EEG as discussed, pls order 72-hour EEG, thanks ----- Message ----- From: Jhonny Moss, MD Sent: 04/03/2024   4:00 PM EDT To: Jhonny Moss, MD

## 2024-04-05 ENCOUNTER — Telehealth: Payer: Self-pay | Admitting: *Deleted

## 2024-04-18 ENCOUNTER — Ambulatory Visit: Admitting: Orthopaedic Surgery

## 2024-05-31 ENCOUNTER — Other Ambulatory Visit

## 2024-05-31 ENCOUNTER — Encounter: Payer: Self-pay | Admitting: Neurology

## 2024-07-05 ENCOUNTER — Ambulatory Visit: Payer: Self-pay

## 2024-08-19 ENCOUNTER — Encounter: Payer: Self-pay | Admitting: Radiology

## 2024-11-12 ENCOUNTER — Ambulatory Visit (HOSPITAL_COMMUNITY): Payer: Self-pay

## 2024-12-02 ENCOUNTER — Ambulatory Visit: Admitting: Family Medicine

## 2025-02-17 ENCOUNTER — Ambulatory Visit: Admitting: Family Medicine
